# Patient Record
Sex: Male | Born: 1935
Health system: Southern US, Community
[De-identification: ages and names within clinical notes are randomized; demographics above are authoritative.]

## PROBLEM LIST (undated history)

## (undated) DIAGNOSIS — M1990 Unspecified osteoarthritis, unspecified site: Secondary | ICD-10-CM

## (undated) DIAGNOSIS — I10 Essential (primary) hypertension: Secondary | ICD-10-CM

## (undated) DIAGNOSIS — N400 Enlarged prostate without lower urinary tract symptoms: Secondary | ICD-10-CM

## (undated) DIAGNOSIS — H269 Unspecified cataract: Secondary | ICD-10-CM

## (undated) DIAGNOSIS — C801 Malignant (primary) neoplasm, unspecified: Secondary | ICD-10-CM

## (undated) DIAGNOSIS — E119 Type 2 diabetes mellitus without complications: Secondary | ICD-10-CM

## (undated) HISTORY — PX: CHEST EXPLORATION: SHX1337

## (undated) HISTORY — PX: COLON RESECTION: SHX5231

## (undated) HISTORY — PX: APPENDECTOMY: SHX54

## (undated) HISTORY — DX: Unspecified cataract: H26.9

## (undated) HISTORY — DX: Unspecified osteoarthritis, unspecified site: M19.90

---

## 2006-03-04 ENCOUNTER — Encounter (INDEPENDENT_AMBULATORY_CARE_PROVIDER_SITE_OTHER): Payer: Self-pay | Admitting: Specialist

## 2006-03-04 ENCOUNTER — Observation Stay (HOSPITAL_COMMUNITY): Admission: AD | Admit: 2006-03-04 | Discharge: 2006-03-05 | Payer: Self-pay | Admitting: Urology

## 2006-03-04 ENCOUNTER — Encounter: Payer: Self-pay | Admitting: Urology

## 2008-05-17 ENCOUNTER — Ambulatory Visit (HOSPITAL_BASED_OUTPATIENT_CLINIC_OR_DEPARTMENT_OTHER): Admission: RE | Admit: 2008-05-17 | Discharge: 2008-05-17 | Payer: Self-pay | Admitting: Urology

## 2008-08-14 ENCOUNTER — Ambulatory Visit (HOSPITAL_COMMUNITY): Admission: RE | Admit: 2008-08-14 | Discharge: 2008-08-15 | Payer: Self-pay | Admitting: Urology

## 2008-11-02 ENCOUNTER — Ambulatory Visit (HOSPITAL_COMMUNITY): Admission: RE | Admit: 2008-11-02 | Discharge: 2008-11-02 | Payer: Self-pay | Admitting: Urology

## 2010-06-07 LAB — BASIC METABOLIC PANEL
CO2: 27 mEq/L (ref 19–32)
Calcium: 9.3 mg/dL (ref 8.4–10.5)
GFR calc Af Amer: 51 mL/min — ABNORMAL LOW (ref >60)
GFR calc non Af Amer: 43 mL/min — ABNORMAL LOW (ref >60)
Glucose, Bld: 101 mg/dL — ABNORMAL HIGH (ref 70–99)
Potassium: 3.9 mEq/L (ref 3.5–5.1)
Sodium: 141 mEq/L (ref 135–145)

## 2010-06-07 LAB — CBC
HCT: 31.8 % — ABNORMAL LOW (ref 39.0–52.0)
MCHC: 34.1 g/dL (ref 30.0–36.0)
MCV: 89.2 fL (ref 78.0–100.0)
Platelets: 348 10*3/uL (ref 150–400)
RBC: 3.56 MIL/uL — ABNORMAL LOW (ref 4.22–5.81)
RDW: 14.4 % (ref 11.5–15.5)
WBC: 6.7 10*3/uL (ref 4.0–10.5)

## 2010-06-10 LAB — BASIC METABOLIC PANEL
BUN: 41 mg/dL — ABNORMAL HIGH (ref 6–23)
CO2: 26 mEq/L (ref 19–32)
Calcium: 8.6 mg/dL (ref 8.4–10.5)
Chloride: 106 mEq/L (ref 96–112)
Creatinine, Ser: 3.41 mg/dL — ABNORMAL HIGH (ref 0.4–1.5)
Creatinine, Ser: 3.79 mg/dL — ABNORMAL HIGH (ref 0.4–1.5)
GFR calc Af Amer: 19 mL/min — ABNORMAL LOW (ref >60)
GFR calc Af Amer: 21 mL/min — ABNORMAL LOW (ref >60)
Potassium: 4.2 mEq/L (ref 3.5–5.1)

## 2010-06-10 LAB — GLUCOSE, CAPILLARY
Glucose-Capillary: 100 mg/dL — ABNORMAL HIGH (ref 70–99)
Glucose-Capillary: 147 mg/dL — ABNORMAL HIGH (ref 70–99)
Glucose-Capillary: 151 mg/dL — ABNORMAL HIGH (ref 70–99)
Glucose-Capillary: 182 mg/dL — ABNORMAL HIGH (ref 70–99)
Glucose-Capillary: 191 mg/dL — ABNORMAL HIGH (ref 70–99)
Glucose-Capillary: 48 mg/dL — ABNORMAL LOW (ref 70–99)
Glucose-Capillary: 70 mg/dL (ref 70–99)

## 2010-06-10 LAB — CBC
HCT: 33.3 % — ABNORMAL LOW (ref 39.0–52.0)
Hemoglobin: 11.3 g/dL — ABNORMAL LOW (ref 13.0–17.0)
MCHC: 33.9 g/dL (ref 30.0–36.0)
MCV: 88.2 fL (ref 78.0–100.0)
Platelets: 303 10*3/uL (ref 150–400)
RBC: 3.51 MIL/uL — ABNORMAL LOW (ref 4.22–5.81)
RBC: 3.77 MIL/uL — ABNORMAL LOW (ref 4.22–5.81)
WBC: 9.3 10*3/uL (ref 4.0–10.5)

## 2010-06-13 LAB — CBC
Hemoglobin: 12.4 g/dL — ABNORMAL LOW (ref 13.0–17.0)
MCHC: 33.5 g/dL (ref 30.0–36.0)
MCV: 86.6 fL (ref 78.0–100.0)
RBC: 4.27 MIL/uL (ref 4.22–5.81)
RDW: 13.6 % (ref 11.5–15.5)

## 2010-06-13 LAB — BASIC METABOLIC PANEL
CO2: 25 mEq/L (ref 19–32)
Calcium: 9.2 mg/dL (ref 8.4–10.5)
Chloride: 110 mEq/L (ref 96–112)
GFR calc Af Amer: >60 mL/min (ref >60)
Glucose, Bld: 96 mg/dL (ref 70–99)
Sodium: 141 mEq/L (ref 135–145)

## 2010-06-13 LAB — URINALYSIS, ROUTINE W REFLEX MICROSCOPIC
Bilirubin Urine: NEGATIVE
Nitrite: NEGATIVE
Protein, ur: 30 mg/dL — AB
Specific Gravity, Urine: 1.02 (ref 1.005–1.030)
Urobilinogen, UA: 0.2 mg/dL (ref 0.0–1.0)

## 2010-06-13 LAB — URINE MICROSCOPIC-ADD ON

## 2010-07-16 NOTE — Op Note (Signed)
NAME:  Lucas David, Lucas David                  ACCOUNT NO.:  [id number]   MEDICAL RECORD NO.:  [id number]          PATIENT TYPE:  OIB   LOCATION:  1517                         FACILITY:  [facility]   PHYSICIAN:  Excell Seltzer. Annabell Howells, M.D.    DATE OF BIRTH:  10/02/1926   DATE OF PROCEDURE:  08/14/2008  DATE OF DISCHARGE:                               OPERATIVE REPORT   PROCEDURE:  Cystoscopy, right retrograde pyelogram with interpretation,  insertion of right double-J stent.   PREOPERATIVE DIAGNOSIS:  Right ureteropelvic junction (UPJ) stone with  pyelonephritis.   POSTOPERATIVE DIAGNOSIS:  Right ureteropelvic junction (UPJ) stone with  pyelonephritis.   SURGEON:  Excell Seltzer. Annabell Howells, MD.   ANESTHESIA:  General.   SPECIMEN:  None.   DRAIN:  6-French, 26 cm, right double-J stent.   COMPLICATIONS:  None.   INDICATIONS:  [name] is a 77 year old white male, who has been  managed by Dr. Aldean Ast in the past for stones.  He has known bilateral  renal calculi and presented today with a 3-day history of right flank  pain and fever to 102.5.  A CT scan in the office revealed about a 9 mm  right UPJ stone with obstruction.  His urinalysis appeared infected.  It  was felt that stenting was indicated with admission overnight for  observation.   FINDINGS/PROCEDURE:  The patient had been given Rocephin 1 g in the  office and 400 mg of Cipro I.V. in the operating room.  He was found  have creatinine of 3.89 preoperatively and a white count of 12.3.  He  was given a general anesthetic and placed in lithotomy position.  The  perineum and genitalia were prepped with Betadine solution.  He was  draped in the usual sterile fashion.  Cystoscopy was performed using the  22-French scope and 12 and 70 degrees lenses.  Examination revealed a  normal urethra, the external sphincter was intact, the prostatic urethra  was approximately 3 to 4 cm in length with trilobar hyperplasia with  coaptation and obstruction.   Examination of the bladder revealed mild to  moderate trabeculation.  The left ureteral orifice was unremarkable.  The right ureteral orifice had a horseshoe configuration and was  slightly lateral.   A 5-French open-end catheter was placed in the right ureteral orifice  and contrast was instilled.  This demonstrated a normal ureter to the  UPJ where there was an ovoid filling defect consistent with the stone.  This filling defect was faintly radiopaque.  No contrast effluxed by the  stone.   Once retrograde pyelogram had been completed, a guidewire was passed  beyond the stone into the kidney and a 6-French, 26 cm, double-J stent  without string was passed without difficulty.  The wire was removed  leaving a good  coil in the kidney, a good coil in the bladder.  Turbid urine was noted  to efflux from the stent.  The bladder was drained, and the patient was  taken down from the lithotomy position.  His anesthetic was reversed.  He was moved to the recovery room  in stable condition, and there were no  complications.      Excell Seltzer. Annabell Howells, M.D.  Electronically Signed     JJW/MEDQ  D:  08/14/2008  T:  08/14/2008  Job:  [id number]

## 2010-07-16 NOTE — Op Note (Signed)
NAME:  Lucas David, Lucas David                  ACCOUNT NO.:  [id number]   MEDICAL RECORD NO.:  [id number]          PATIENT TYPE:  AMB   LOCATION:  NESC                         FACILITY:  [facility]   PHYSICIAN:  Courtney Paris, M.D.DATE OF BIRTH:  19-Jan-1927   DATE OF PROCEDURE:  05/17/2008  DATE OF DISCHARGE:                               OPERATIVE REPORT   PREOPERATIVE DIAGNOSES:  Bladder calculi.   POSTOPERATIVE DIAGNOSES:  Bladder calculi.   PROCEDURES PERFORMED:  1. Cystourethroscopy.  2. Laser lithotripsy and cysto litholapaxy (>2.5 cm stones).   SURGEON:  Courtney Paris, M.D.   RESIDENT:  Melburn Hake   ANESTHESIA:  General.   DRAINS:  22-French Foley catheter.   COMPLICATIONS:  None.   ESTIMATED BLOOD LOSS:  Minimal.   INDICATIONS FOR PROCEDURE:  Patient is a 77 year old gentleman who was  evaluated by Dr. Vic Blackbird in urology clinic for complaints of  difficulty with urination.  Patient had a history of nephrolithiasis,  underwent a CT scan of the abdomen and pelvis, which revealed  questionable bladder calculi, versus bilateral ureterocele stone.  Patient underwent office cystoscopy, which revealed two large bladder  calculi.  Patient was counseled about different treatment options.  Patient chose cystoscopy, laser lithotripsy and removal of stone.  Risks  and benefits of the procedure were explained, informed consent obtained.   DESCRIPTION OF PROCEDURE IN DETAIL:  Patient brought to the operating  room, placed in supine position, administered general anesthesia by the  anesthesia team.  Proper time-out was performed, identifying patient,  procedure and the site.  Patient was given appropriate preoperative  antibiotic.  Patient was  placed in dorsal lithotomy position.  Pressure  points were padded.  Bilateral lower extremity SCDs were applied.  Patient prepped and draped in the usual sterile manner.   Using a 22-French sheath and a 12-degree lens, we  then performed  cystourethroscopy.  Patient's anterior urethra was normal.  Posterior  urethra revealed moderate bilobar prosthetic enlargement up underneath  the bladder.  The right and left ureteric orifices were found normal, in  normal position.  The rest of the cystoscopy did not reveal any mass or  lesion.  However, there were two large bladder calculi at the base of  the bladder (>2.5 cm ).   We then introduced 400-micron laser fiber and systematically performed  laser lithotripsy of both the stones.  The stones were broken down into  tiny pieces, less than 5 mm in size.  We used total of 9.9 kilojoules of  energy.  The laser lithotripsy was used initially at a setting of 0.6  and 6.  It was advanced to 0.8 and 8 and then 1 and 10.  We then used  the Crest syringe and performed bladder irrigation.  Upon re-look with  the cystoscope, only a small amount of debris was left.  There were five  or six pieces that were about 5 mm or so.  We then used the Nitinol  basket and performed extraction of these stones.  Once again, we  irrigated the bladder.  No more  significant stone pieces were seen.  At  that point we drained the patient's bladder and removed the cystoscopic  sheath.  We then placed a 22-French Foley catheter and irrigated the  catheter to light pink.  This marked the end of our procedure.  Patient  was subsequently extubated and transferred in stable condition to the  recovery room.   Note:  Dr. Aldean Ast was present and available for all the aspects of  the case.     ______________________________  Melburn Hake, MD      Courtney Paris, M.D.  Electronically Signed    JJ/MEDQ  D:  05/17/2008  T:  05/17/2008  Job:  [id number]

## 2010-07-16 NOTE — Discharge Summary (Signed)
NAME:  Lucas David, Lucas David                  ACCOUNT NO.:  [id number]   MEDICAL RECORD NO.:  [id number]          PATIENT TYPE:  OIB   LOCATION:  1517                         FACILITY:  [facility]   PHYSICIAN:  Excell Seltzer. Annabell Howells, M.D.    DATE OF BIRTH:  [birth year]   DATE OF ADMISSION:  08/14/2008  DATE OF DISCHARGE:  08/15/2008                               DISCHARGE SUMMARY   Doreen Beam, MD  [address]  [place], [place] [address]   Dear Dr. Sherril Croon:   Mr. Swor was admitted to the hospital on August 14, 2008 for an  obstructing right UPJ stone with pyelonephritis/urosepsis. At the time  of admission he was found to have a creatinine of 3.79, up from 1.18 in  March. He underwent placement of a right ureteral stent on August 14, 2008  and was given Rocephin in our office followed by Cipro.  His urine  culture is pending.  A follow-up creatinine on August 14, 2008 was down to  3.41.  His glucose was 242. His Metformin has been held because of his  elevated creatinine, which should recover now that he is been stented  and placed on antibiotics.   I have asked him to follow up with you later this week for repeat BUN  and creatinine. I will defer to your judgment when he can resume his  metformin. He will follow up with Dr. Aldean Ast next week to discuss  further therapy of his stone.   I you have any questions please feel free to contact me.   Respectfully,   Excell Seltzer. Annabell Howells, MD   __________      Excell Seltzer. Annabell Howells, M.D.  Electronically Signed     JJW/MEDQ  D:  08/15/2008  T:  08/15/2008  Job:  [id number]

## 2010-07-19 NOTE — Discharge Summary (Signed)
NAME:  Lucas David, Lucas David                  ACCOUNT NO.:  [id number]   MEDICAL RECORD NO.:  [id number]          PATIENT TYPE:  OBV   LOCATION:  1437                         FACILITY:  [facility]   PHYSICIAN:  Courtney Paris, M.D.DATE OF BIRTH:  04-27-1926   DATE OF ADMISSION:  03/04/2006  DATE OF DISCHARGE:  03/05/2006                               DISCHARGE SUMMARY   DISCHARGE DIAGNOSES:  1. Bladder stones.  2. Obstructive uropathy.  3. Hematuria.  4. Inflammatory lesion in bladder.  5. Type 2 diabetes.   OPERATIONS AND PROCEDURES:  Bladder biopsy and vesicolithopexy (greater  than 2.5 cm) on March 04, 2006.   BRIEF HISTORY:  The 77 year old patient was admitted with an  inflammatory bladder lesion for biopsy and obstructive uropathy and a  large bladder stone in his bladder.  He had the biopsy and the bladder  stone was quite hard and took nearly 2 hours of time.  When I finished  he was having some oozing.  I could not get all the stone fragments out  and I felt it was best to admit him for extended observation.  He had  several pieces in the bladder.  He has bilateral 7 mm kidney stones but  has never passed any.  He does have nocturia every 1 hour.  He has a  small capacity obstructing unstable bladder but with the lesion in the  bladder which may have been due to the bladder stone, I wanted to make  sure that this was not cancer before further definitive therapy was  undertaken.  He did have some bladder sounds removed by Dr. Rito Ehrlich in  [place], in June as well.   After surgery he was admitted for continuous bladder irrigation and  follow-up.  He did well.  The urine cleared up overnight.  The catheter  came out and he has been able to void without blood.  He has passed a  few small stones and was thought able to go home.  He was sent home on  his metformin, his gemfibrozil, hydrochlorothiazide, glipizide,  lisinopril and finasteride.  I added some Flomax to him as well and  some  Cipro for 10 days.  He will come back to the office next week for follow-  up.  He did have on his chest x-ray a right upper lobe pulmonary nodular  opacity so a CT scan will be done when he comes back for follow-up.  Sent home in improved ambulatory condition on a regular diet.      Courtney Paris, M.D.  Electronically Signed     HMK/MEDQ  D:  03/05/2006  T:  03/05/2006  Job:  [id number]

## 2010-07-19 NOTE — Op Note (Signed)
NAME:  Lucas David, Lucas David                  ACCOUNT NO.:  [id number]   MEDICAL RECORD NO.:  [id number]          PATIENT TYPE:  AMB   LOCATION:  NESC                         FACILITY:  [facility]   PHYSICIAN:  Courtney Paris, M.D.DATE OF BIRTH:  02-26-1927   DATE OF PROCEDURE:  03/04/2006  DATE OF DISCHARGE:                               OPERATIVE REPORT   PREOPERATIVE DIAGNOSIS:  Inflammatory bladder lesion, obstructive  uropathy, large bladder stones.   POSTOPERATIVE DIAGNOSIS:  Inflammatory bladder lesion, obstructive  uropathy, large bladder stones.   OPERATION:  Cysto, bladder biopsy and (1.5 cm), electrohydraulic  lithotripsy of large bladder stone.   ANESTHESIA:  General.   SURGEON:  Courtney Paris, M.D.   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  He had  some stones removed by Dr. Rito Ehrlich 08/2005 but still has a fairly large  stone present in the bladder.  He has bilateral kidney stones but has  never passed and he is diabetic.  He has a markedly obstructive unstable  bladder on urodynamics and enters now to have biopsy of this  inflammatory bladder lesion which could just be from the stone  irritation of the bladder but we want to make sure it was not cancer and  then try to break up the bladder stone now.  He probably will need TURP  in the future but want to make sure that this was not a bladder cancer  and try to break the stone up now if possible.   The patient was placed on the operating table in dorsal lithotomy  position.  After satisfactory induction of general anesthesia, he was  prepped and draped with Betadine in the usual sterile fashion.  He was  given IV antibiotics.  The panendoscope was inserted.  No anterior  strictures were seen but he had a very large trilobar prostate about 5  to 6 cm in length.  The bladder was entered and the rather large stone  and  another smaller stone was seen in the bladder.  The lesion of  posterior bladder wall was photographed and then biopsied with a cold  cup biopsy forceps and the base fulgurated with the Bugbee electrode.  This effected good hemostasis.  Then using a 500 micron fiber with a  holmium laser, the stone was then addressed.  It was very hard and  actually burned through two fibers and was working on a third when I was  able to break the stone into enough pieces that I thought I could  irrigate.  I tried to pull some with grasping forceps but there was some  oozing present and prevented very clear view.  For this reason I got out  as many stone fragments as I could, I placed  a 24 two-way Foley  catheter and with irrigant with 2000 mL of saline was able get a few  blood clots out and a few more stone fragments but left this to  continuous irrigation.  We will admit him for extended recovery  and then  try to remove the catheter in the morning.  The length of the time spent  just lasering was an hour and a half, due to this very hard stone.      Courtney Paris, M.D.  Electronically Signed    HMK/MEDQ  D:  03/04/2006  T:  03/04/2006  Job:  [id number]

## 2010-08-07 ENCOUNTER — Other Ambulatory Visit: Payer: Self-pay | Admitting: Urology

## 2010-08-07 DIAGNOSIS — N200 Calculus of kidney: Secondary | ICD-10-CM

## 2010-08-23 ENCOUNTER — Encounter (HOSPITAL_COMMUNITY): Payer: Medicare Other

## 2010-08-23 ENCOUNTER — Other Ambulatory Visit (HOSPITAL_COMMUNITY): Payer: Self-pay | Admitting: Urology

## 2010-08-23 ENCOUNTER — Other Ambulatory Visit: Payer: Self-pay | Admitting: Interventional Radiology

## 2010-08-23 ENCOUNTER — Ambulatory Visit (HOSPITAL_COMMUNITY)
Admission: RE | Admit: 2010-08-23 | Discharge: 2010-08-23 | Disposition: A | Discharge status: Home / Self Care | Payer: Medicare Other | Source: Ambulatory Visit | Attending: Urology | Admitting: Urology

## 2010-08-23 DIAGNOSIS — I517 Cardiomegaly: Secondary | ICD-10-CM | POA: Insufficient documentation

## 2010-08-23 DIAGNOSIS — Z01810 Encounter for preprocedural cardiovascular examination: Secondary | ICD-10-CM | POA: Insufficient documentation

## 2010-08-23 DIAGNOSIS — N200 Calculus of kidney: Secondary | ICD-10-CM | POA: Insufficient documentation

## 2010-08-23 DIAGNOSIS — E119 Type 2 diabetes mellitus without complications: Secondary | ICD-10-CM | POA: Insufficient documentation

## 2010-08-23 DIAGNOSIS — I10 Essential (primary) hypertension: Secondary | ICD-10-CM | POA: Insufficient documentation

## 2010-08-23 DIAGNOSIS — Z01818 Encounter for other preprocedural examination: Secondary | ICD-10-CM | POA: Insufficient documentation

## 2010-08-23 LAB — BASIC METABOLIC PANEL
BUN: 37 mg/dL — ABNORMAL HIGH (ref 6–23)
Calcium: 9.6 mg/dL (ref 8.4–10.5)
Creatinine, Ser: 2.07 mg/dL — ABNORMAL HIGH (ref 0.50–1.35)
GFR calc Af Amer: 37 mL/min — ABNORMAL LOW (ref >60)
GFR calc non Af Amer: 31 mL/min — ABNORMAL LOW (ref >60)

## 2010-08-23 LAB — CBC
HCT: 32.7 % — ABNORMAL LOW (ref 39.0–52.0)
MCH: 27.8 pg (ref 26.0–34.0)
MCHC: 31.5 g/dL (ref 30.0–36.0)
MCV: 88.1 fL (ref 78.0–100.0)
Platelets: 271 10*3/uL (ref 150–400)
RDW: 14.3 % (ref 11.5–15.5)

## 2010-08-23 LAB — SURGICAL PCR SCREEN: MRSA, PCR: NEGATIVE

## 2010-09-02 ENCOUNTER — Ambulatory Visit (HOSPITAL_COMMUNITY): Payer: Medicare Other

## 2010-09-02 ENCOUNTER — Ambulatory Visit (HOSPITAL_COMMUNITY)
Admission: RE | Admit: 2010-09-02 | Discharge: 2010-09-03 | Disposition: A | Discharge status: Home / Self Care | Payer: Medicare Other | Source: Ambulatory Visit | Attending: Urology | Admitting: Urology

## 2010-09-02 ENCOUNTER — Ambulatory Visit (HOSPITAL_COMMUNITY)
Admission: RE | Admit: 2010-09-02 | Discharge: 2010-09-02 | Disposition: A | Discharge status: Home / Self Care | Payer: Medicare Other | Source: Ambulatory Visit | Attending: Urology | Admitting: Urology

## 2010-09-02 DIAGNOSIS — Z01812 Encounter for preprocedural laboratory examination: Secondary | ICD-10-CM | POA: Insufficient documentation

## 2010-09-02 DIAGNOSIS — N200 Calculus of kidney: Secondary | ICD-10-CM | POA: Insufficient documentation

## 2010-09-02 DIAGNOSIS — E7800 Pure hypercholesterolemia, unspecified: Secondary | ICD-10-CM | POA: Insufficient documentation

## 2010-09-02 DIAGNOSIS — Z79899 Other long term (current) drug therapy: Secondary | ICD-10-CM | POA: Insufficient documentation

## 2010-09-02 DIAGNOSIS — Z85038 Personal history of other malignant neoplasm of large intestine: Secondary | ICD-10-CM | POA: Insufficient documentation

## 2010-09-02 DIAGNOSIS — I10 Essential (primary) hypertension: Secondary | ICD-10-CM | POA: Insufficient documentation

## 2010-09-02 DIAGNOSIS — E119 Type 2 diabetes mellitus without complications: Secondary | ICD-10-CM | POA: Insufficient documentation

## 2010-09-02 LAB — GLUCOSE, CAPILLARY
Glucose-Capillary: 108 mg/dL — ABNORMAL HIGH (ref 70–99)
Glucose-Capillary: 92 mg/dL (ref 70–99)
Glucose-Capillary: 101 mg/dL — ABNORMAL HIGH (ref 70–99)
Glucose-Capillary: 144 mg/dL — ABNORMAL HIGH (ref 70–99)
Glucose-Capillary: 177 mg/dL — ABNORMAL HIGH (ref 70–99)

## 2010-09-02 LAB — TYPE AND SCREEN
ABO/RH(D): A POS
ABO/RH(D): A POS
Antibody Screen: NEGATIVE

## 2010-09-02 LAB — HEMOGLOBIN AND HEMATOCRIT, BLOOD
HCT: 29.7 % — ABNORMAL LOW (ref 39.0–52.0)
Hemoglobin: 9.5 g/dL — ABNORMAL LOW (ref 13.0–17.0)

## 2010-09-02 LAB — ABO/RH: ABO/RH(D): A POS

## 2010-09-02 MED ORDER — IOHEXOL 300 MG/ML  SOLN: 50.0000 mL | Freq: Once | INTRAMUSCULAR | Status: AC | PRN

## 2010-09-03 LAB — GLUCOSE, CAPILLARY: Glucose-Capillary: 106 mg/dL — ABNORMAL HIGH (ref 70–99)

## 2010-09-11 NOTE — Op Note (Signed)
NAMEKULLEN, TOMASETTI                  ACCOUNT NO.:  [id number]  MEDICAL RECORD NO.:  [id number]  LOCATION:  1402                         FACILITY:  [facility]  PHYSICIAN:  Marlowe Cinquemani C. Vernie Ammons, M.D.  DATE OF BIRTH:  November 17, 1926  DATE OF PROCEDURE:  09/02/2010 DATE OF DISCHARGE:                              OPERATIVE REPORT   PREOPERATIVE DIAGNOSIS:  Left partial staghorn calculus.  POSTOPERATIVE DIAGNOSIS:  Left partial staghorn calculus.  PROCEDURES: 1. Left percutaneous nephrostolithotomy. 2. Antegrade double-J stent placement.  SURGEON:  Heman Que C. Vernie Ammons, M.D.  ANESTHESIA:  General.  SPECIMENS:  Stone given to the patient.  DRAINS:  18-French Foley catheter in the bladder and a 6-French, 24-cm double-J stent in the left ureter (no string).  BLOOD LOSS:  Less than 100 mL.  COMPLICATIONS:  None.  INDICATIONS:  The patient is an 77 year old male with a left partial staghorn calculus located in the lower pole and extending in the renal pelvis with Hounsfield units of approximately 500.  He was having difficulty with recurrent infections and it was thought that this could possibly be serving as a nidus of infection.  The stone measured 3.5 cm in length.  The procedure, its risks and complications as well as alternatives were discussed and the patient has elected to proceed with surgery.  DESCRIPTION OF OPERATION:  After informed consent, the patient was brought to the major OR, placed on table, administered general anesthesia and then moved to the prone position.  His left flank as well as nephrostomy catheter was then sterilely prepped and draped and an official time-out was then performed.  The 0.038-inch super stiff guidewire was then passed through the nephrostomy catheter and down into the bladder under direct fluoroscopic visualization.  I then made a transverse incision at the location of the nephrostomy catheter and removed the nephrostomy catheter leaving the guidewire in  place.  Next, a coaxial peel-away catheter was passed over the guidewire and down the ureter under fluoroscopy.  The inner portion of the coaxial system was removed leaving the guidewire in place and allowing the passage of a second guidewire down into the bladder.  Both guidewires were left in place and the coaxial catheter was then removed. I then dilated the nephrostomy tract with a nephrostomy dilating balloon, inflated to 14 atmospheres under fluoroscopy and then passed the 30-French nephrostomy sheath over the balloon and into the area of the lower pole calyx.  The balloon was deflated and backed over the guidewire leaving the nephrostomy catheter and working as well as safety guidewires in place.  The 28-French rigid nephroscope was then passed under direct vision and some clot seen in the collecting system were evacuated.  I then was able to visualize the stone and it was fragmented initially and several small pieces which were then grasped using a combination of the two-prong grasper as well as three-prong grasper.  Several smaller stones were located in the lower pole calyx and these were grasped and removed as well.  Then, the large portion that extended into the area of the renal pelvis was fragmented and extracted in identical fashion.  I then inspected and found more stones in what  appeared to be a parallel calyx in the lower pole.  This was extracted as well after fragmentation with the EchoStar.  I then inspected all the should the calyces of the lower pole as well as the renal pelvis and noted no further fragments.  I could see down to the UPJ and down the proximal ureter, and no stone fragments were located in this region either.  I then evaluated the collecting system and renal pelvis extending toward the upper pole and no stone was located in this region either.  Having cleared him of all stones and having had very minimal bleeding throughout the procedure, I  elected to proceed without the placement of the nephrostomy tube, but did elect to place a stent.  The rigid nephroscope was back loaded over the working guidewire and the double-J stent was passed over the guidewire under fluoroscopy into the area of the bladder.  As I began to remove the guidewire, good curl was noted in the bladder.  I completely removed the guidewire and then positioned the stent in the renal pelvis region with two-prong graspers through the nephroscope.  I then measured the distance from the edge of the renal parenchyma to the top of the nephrostomy sheath and then used this distance to Jacquelyn Antony the laparoscopic FloSeal introducer.  I passed the laparoscopic instrument through the access sheath and then began to remove the access sheath while maintaining this in the position that would place it just outside the collecting system and then began injecting the FloSeal as I slowly removed the laparoscopic applicator.  A total of 10 mL of FloSeal were used to control any bleeding and none was noted after I removed the device.  I therefore closed the skin with a running subcuticular 4-0 Monocryl suture and applied a sterile dressing.  The patient was awakened and taken to recovery room in stable and satisfactory condition.  He tolerated the procedure well with no intraoperative complications.  He will be observed overnight and maintained on intravenous antibiotics with anticipation of discharge in the morning.     Odysseus Cada C. Vernie Ammons, M.D.     MCO/MEDQ  D:  09/02/2010  T:  09/02/2010  Job:  [id number]  Electronically Signed by Ihor Gully M.D. on 09/11/2010 04:48:40 AM

## 2011-12-09 ENCOUNTER — Encounter (HOSPITAL_COMMUNITY): Payer: Self-pay

## 2011-12-09 ENCOUNTER — Encounter (HOSPITAL_COMMUNITY): Payer: Self-pay | Admitting: Pharmacy Technician

## 2011-12-09 ENCOUNTER — Encounter (HOSPITAL_COMMUNITY)
Admission: RE | Admit: 2011-12-09 | Discharge: 2011-12-09 | Disposition: A | Discharge status: Home / Self Care | Payer: Medicare Other | Source: Ambulatory Visit | Attending: Ophthalmology | Admitting: Ophthalmology

## 2011-12-09 HISTORY — DX: Essential (primary) hypertension: I10

## 2011-12-09 HISTORY — DX: Type 2 diabetes mellitus without complications: E11.9

## 2011-12-09 HISTORY — DX: Benign prostatic hyperplasia without lower urinary tract symptoms: N40.0

## 2011-12-09 HISTORY — DX: Malignant (primary) neoplasm, unspecified: C80.1

## 2011-12-09 LAB — BASIC METABOLIC PANEL
BUN: 36 mg/dL — ABNORMAL HIGH (ref 6–23)
Calcium: 10 mg/dL (ref 8.4–10.5)
GFR calc non Af Amer: 34 mL/min — ABNORMAL LOW (ref >90)
Glucose, Bld: 171 mg/dL — ABNORMAL HIGH (ref 70–99)

## 2011-12-09 NOTE — Patient Instructions (Addendum)
Your procedure is scheduled on: 12/15/2011  Report to [facility] at  1030       AM.  Call this number if you have problems the morning of surgery: [phone]   Do not eat food or drink liquids :After Midnight.      Take these medicines the morning of surgery with A SIP OF WATER:proscar,hctz,lisinopril,ditropan,flomax    Do not wear jewelry, make-up or nail polish.  Do not wear lotions, powders, or perfumes. You may wear deodorant.  Do not shave 48 hours prior to surgery.  Do not bring valuables to the hospital.  Contacts, dentures or bridgework may not be worn into surgery.  Leave suitcase in the car. After surgery it may be brought to your room.  For patients admitted to the hospital, checkout time is 11:00 AM the day of discharge.   Patients discharged the day of surgery will not be allowed to drive home.  :     Please read over the following fact sheets that you were given: Coughing and Deep Breathing, Surgical Site Infection Prevention, Anesthesia Post-op Instructions and Care and Recovery After Surgery    Cataract A cataract is a clouding of the lens of the eye. When a lens becomes cloudy, vision is reduced based on the degree and nature of the clouding. Many cataracts reduce vision to some degree. Some cataracts make people more near-sighted as they develop. Other cataracts increase glare. Cataracts that are ignored and become worse can sometimes look white. The white color can be seen through the pupil. CAUSES   Aging. However, cataracts may occur at any age, even in newborns.   Certain drugs.   Trauma to the eye.   Certain diseases such as diabetes.   Specific eye diseases such as chronic inflammation inside the eye or a sudden attack of a rare form of glaucoma.   Inherited or acquired medical problems.  SYMPTOMS   Gradual, progressive drop in vision in the affected eye.   Severe, rapid visual loss. This most often happens when trauma is the cause.  DIAGNOSIS  To  detect a cataract, an eye doctor examines the lens. Cataracts are best diagnosed with an exam of the eyes with the pupils enlarged (dilated) by drops.  TREATMENT  For an early cataract, vision may improve by using different eyeglasses or stronger lighting. If that does not help your vision, surgery is the only effective treatment. A cataract needs to be surgically removed when vision loss interferes with your everyday activities, such as driving, reading, or watching TV. A cataract may also have to be removed if it prevents examination or treatment of another eye problem. Surgery removes the cloudy lens and usually replaces it with a substitute lens (intraocular lens, IOL).  At a time when both you and your doctor agree, the cataract will be surgically removed. If you have cataracts in both eyes, only one is usually removed at a time. This allows the operated eye to heal and be out of danger from any possible problems after surgery (such as infection or poor wound healing). In rare cases, a cataract may be doing damage to your eye. In these cases, your caregiver may advise surgical removal right away. The vast majority of people who have cataract surgery have better vision afterward. HOME CARE INSTRUCTIONS  If you are not planning surgery, you may be asked to do the following:  Use different eyeglasses.   Use stronger or brighter lighting.   Ask your eye doctor about  reducing your medicine dose or changing medicines if it is thought that a medicine caused your cataract. Changing medicines does not make the cataract go away on its own.   Become familiar with your surroundings. Poor vision can lead to injury. Avoid bumping into things on the affected side. You are at a higher risk for tripping or falling.   Exercise extreme care when driving or operating machinery.   Wear sunglasses if you are sensitive to bright light or experiencing problems with glare.  SEEK IMMEDIATE MEDICAL CARE IF:   You have  a worsening or sudden vision loss.   You notice redness, swelling, or increasing pain in the eye.   You have a fever.  Document Released: 02/17/2005 Document Revised: 02/06/2011 Document Reviewed: 10/11/2010 [facility] Patient Information 2012 [place], [place].PATIENT INSTRUCTIONS POST-ANESTHESIA  IMMEDIATELY FOLLOWING SURGERY:  Do not drive or operate machinery for the first twenty four hours after surgery.  Do not make any important decisions for twenty four hours after surgery or while taking narcotic pain medications or sedatives.  If you develop intractable nausea and vomiting or a severe headache please notify your doctor immediately.  FOLLOW-UP:  Please make an appointment with your surgeon as instructed. You do not need to follow up with anesthesia unless specifically instructed to do so.  WOUND CARE INSTRUCTIONS (if applicable):  Keep a dry clean dressing on the anesthesia/puncture wound site if there is drainage.  Once the wound has quit draining you may leave it open to air.  Generally you should leave the bandage intact for twenty four hours unless there is drainage.  If the epidural site drains for more than 36-48 hours please call the anesthesia department.  QUESTIONS?:  Please feel free to call your physician or the hospital operator if you have any questions, and they will be happy to assist you.

## 2011-12-12 MED ORDER — LIDOCAINE HCL 3.5 % OP GEL
OPHTHALMIC | Status: AC
  Filled 2011-12-12: qty 5

## 2011-12-12 MED ORDER — TETRACAINE HCL 0.5 % OP SOLN
OPHTHALMIC | Status: AC
  Filled 2011-12-12: qty 2

## 2011-12-12 MED ORDER — LIDOCAINE HCL (PF) 1 % IJ SOLN
INTRAMUSCULAR | Status: AC
  Filled 2011-12-12: qty 2

## 2011-12-12 MED ORDER — PHENYLEPHRINE HCL 2.5 % OP SOLN
OPHTHALMIC | Status: AC
  Filled 2011-12-12: qty 2

## 2011-12-12 MED ORDER — CYCLOPENTOLATE HCL 1 % OP SOLN
OPHTHALMIC | Status: AC
  Filled 2011-12-12: qty 2

## 2011-12-12 MED ORDER — NEOMYCIN-POLYMYXIN-DEXAMETH 3.5-10000-0.1 OP OINT
TOPICAL_OINTMENT | OPHTHALMIC | Status: AC
  Filled 2011-12-12: qty 3.5

## 2011-12-15 ENCOUNTER — Encounter (HOSPITAL_COMMUNITY): Payer: Self-pay | Admitting: *Deleted

## 2011-12-15 ENCOUNTER — Ambulatory Visit (HOSPITAL_COMMUNITY): Payer: Medicare Other | Admitting: Anesthesiology

## 2011-12-15 ENCOUNTER — Ambulatory Visit (HOSPITAL_COMMUNITY)
Admission: RE | Admit: 2011-12-15 | Discharge: 2011-12-15 | Disposition: A | Discharge status: Home / Self Care | Payer: Medicare Other | Source: Ambulatory Visit | Attending: Ophthalmology | Admitting: Ophthalmology

## 2011-12-15 ENCOUNTER — Encounter (HOSPITAL_COMMUNITY)
Admission: RE | Disposition: A | Discharge status: Home / Self Care | Payer: Self-pay | Source: Ambulatory Visit | Attending: Ophthalmology

## 2011-12-15 ENCOUNTER — Encounter (HOSPITAL_COMMUNITY): Payer: Self-pay | Admitting: Anesthesiology

## 2011-12-15 ENCOUNTER — Encounter (HOSPITAL_COMMUNITY): Payer: Self-pay

## 2011-12-15 DIAGNOSIS — Z01812 Encounter for preprocedural laboratory examination: Secondary | ICD-10-CM | POA: Insufficient documentation

## 2011-12-15 DIAGNOSIS — I10 Essential (primary) hypertension: Secondary | ICD-10-CM | POA: Insufficient documentation

## 2011-12-15 DIAGNOSIS — E119 Type 2 diabetes mellitus without complications: Secondary | ICD-10-CM | POA: Insufficient documentation

## 2011-12-15 DIAGNOSIS — H2589 Other age-related cataract: Secondary | ICD-10-CM | POA: Insufficient documentation

## 2011-12-15 DIAGNOSIS — H2181 Floppy iris syndrome: Secondary | ICD-10-CM | POA: Insufficient documentation

## 2011-12-15 HISTORY — PX: CATARACT EXTRACTION W/PHACO: SHX586

## 2011-12-15 LAB — GLUCOSE, CAPILLARY: Glucose-Capillary: 100 mg/dL — ABNORMAL HIGH (ref 70–99)

## 2011-12-15 SURGERY — PHACOEMULSIFICATION, CATARACT, WITH IOL INSERTION
Anesthesia: Monitor Anesthesia Care | Site: Eye | Laterality: Right | Wound class: Clean

## 2011-12-15 MED ORDER — ONDANSETRON HCL 4 MG/2ML IJ SOLN: 4.0000 mg | Freq: Once | INTRAMUSCULAR | Status: DC | PRN

## 2011-12-15 MED ORDER — LIDOCAINE HCL (PF) 1 % IJ SOLN
INTRAOCULAR | Status: DC | PRN
  Administered 2011-12-15: 12:00:00 via OPHTHALMIC

## 2011-12-15 MED ORDER — CYCLOPENTOLATE-PHENYLEPHRINE 0.2-1 % OP SOLN: 1.0000 [drp] | OPHTHALMIC | Status: AC

## 2011-12-15 MED ORDER — PHENYLEPHRINE HCL 2.5 % OP SOLN
1.0000 [drp] | OPHTHALMIC | Status: AC
  Administered 2011-12-15 (×3): 1 [drp] via OPHTHALMIC

## 2011-12-15 MED ORDER — EPINEPHRINE HCL 1 MG/ML IJ SOLN
INTRAMUSCULAR | Status: AC
  Filled 2011-12-15: qty 1

## 2011-12-15 MED ORDER — FENTANYL CITRATE 0.05 MG/ML IJ SOLN: 25.0000 ug | INTRAMUSCULAR | Status: DC | PRN

## 2011-12-15 MED ORDER — MIDAZOLAM HCL 2 MG/2ML IJ SOLN
1.0000 mg | INTRAMUSCULAR | Status: DC | PRN
  Administered 2011-12-15: 2 mg via INTRAVENOUS

## 2011-12-15 MED ORDER — LIDOCAINE HCL 3.5 % OP GEL
1.0000 "application " | Freq: Once | OPHTHALMIC | Status: AC
  Administered 2011-12-15: 1 via OPHTHALMIC

## 2011-12-15 MED ORDER — NEOMYCIN-POLYMYXIN-DEXAMETH 0.1 % OP OINT
TOPICAL_OINTMENT | OPHTHALMIC | Status: DC | PRN
  Administered 2011-12-15: 1 via OPHTHALMIC

## 2011-12-15 MED ORDER — PROVISC 10 MG/ML IO SOLN
INTRAOCULAR | Status: DC | PRN
  Administered 2011-12-15: 8.5 mg via INTRAOCULAR

## 2011-12-15 MED ORDER — BSS IO SOLN
INTRAOCULAR | Status: DC | PRN
  Administered 2011-12-15: 15 mL via INTRAOCULAR

## 2011-12-15 MED ORDER — EPINEPHRINE HCL 1 MG/ML IJ SOLN
INTRAOCULAR | Status: DC | PRN
  Administered 2011-12-15: 12:00:00

## 2011-12-15 MED ORDER — TETRACAINE HCL 0.5 % OP SOLN
1.0000 [drp] | OPHTHALMIC | Status: AC
  Administered 2011-12-15 (×3): 1 [drp] via OPHTHALMIC

## 2011-12-15 MED ORDER — POVIDONE-IODINE 5 % OP SOLN
OPHTHALMIC | Status: DC | PRN
  Administered 2011-12-15: 1 via OPHTHALMIC

## 2011-12-15 MED ORDER — CYCLOPENTOLATE HCL 1 % OP SOLN
1.0000 [drp] | OPHTHALMIC | Status: AC
  Administered 2011-12-15 (×3): 1 [drp] via OPHTHALMIC

## 2011-12-15 MED ORDER — LACTATED RINGERS IV SOLN
INTRAVENOUS | Status: DC
  Administered 2011-12-15: 1000 mL via INTRAVENOUS

## 2011-12-15 MED ORDER — MIDAZOLAM HCL 2 MG/2ML IJ SOLN
INTRAMUSCULAR | Status: AC
  Filled 2011-12-15: qty 2

## 2011-12-15 SURGICAL SUPPLY — 12 items
CLOTH BEACON ORANGE TIMEOUT ST (SAFETY) ×1 IMPLANT
EYE SHIELD UNIVERSAL CLEAR (GAUZE/BANDAGES/DRESSINGS) ×1 IMPLANT
GLOVE BIOGEL PI IND STRL 6.5 (GLOVE) IMPLANT
GLOVE BIOGEL PI INDICATOR 6.5 (GLOVE) ×1
GLOVE EXAM NITRILE MD LF STRL (GLOVE) ×1 IMPLANT
PAD ARMBOARD 7.5X6 YLW CONV (MISCELLANEOUS) ×1 IMPLANT
RING MALYGIN (MISCELLANEOUS) ×1 IMPLANT
SIGHTPATH CAT PROC W REG LENS (Ophthalmic Related) ×2 IMPLANT
SYR TB 1ML LL NO SAFETY (SYRINGE) ×1 IMPLANT
TAPE SURG TRANSPORE 1 IN (GAUZE/BANDAGES/DRESSINGS) IMPLANT
TAPE SURGICAL TRANSPORE 1 IN (GAUZE/BANDAGES/DRESSINGS) ×1
WATER STERILE IRR 250ML POUR (IV SOLUTION) ×1 IMPLANT

## 2011-12-15 NOTE — Anesthesia Preprocedure Evaluation (Addendum)
Anesthesia Evaluation  Patient identified by MRN, date of birth, ID band Patient awake    Reviewed: Allergy & Precautions, H&P , NPO status , Patient's Chart, lab work & pertinent test results  Airway Mallampati: II      Dental  (+) Teeth Intact   Pulmonary neg pulmonary ROS,  breath sounds clear to auscultation        Cardiovascular hypertension, Pt. on medications Rhythm:Regular Rate:Bradycardia     Neuro/Psych    GI/Hepatic   Endo/Other  diabetes, Well Controlled, Type 2, Oral Hypoglycemic Agents  Renal/GU      Musculoskeletal   Abdominal   Peds  Hematology   Anesthesia Other Findings   Reproductive/Obstetrics                           Anesthesia Physical Anesthesia Plan  ASA: III  Anesthesia Plan: MAC   Post-op Pain Management:    Induction: Intravenous  Airway Management Planned: Nasal Cannula  Additional Equipment:   Intra-op Plan:   Post-operative Plan:   Informed Consent: I have reviewed the patients History and Physical, chart, labs and discussed the procedure including the risks, benefits and alternatives for the proposed anesthesia with the patient or authorized representative who has indicated his/her understanding and acceptance.     Plan Discussed with:   Anesthesia Plan Comments:         Anesthesia Quick Evaluation  

## 2011-12-15 NOTE — Brief Op Note (Signed)
Pre-Op Dx: Cataract OD Post-Op Dx: Cataract, Intraoperative Floppy Iris Syndrome OD Surgeon: Gemma Payor Anesthesia: Topical with MAC Surgery: Cataract Extraction with Intraocular lens Implant OD Implant: B&L enVista Specimen: None Complications: None

## 2011-12-15 NOTE — Transfer of Care (Signed)
Immediate Anesthesia Transfer of Care Note  Patient: Lucas David  Procedure(s) Performed: Procedure(s) (LRB): CATARACT EXTRACTION PHACO AND INTRAOCULAR LENS PLACEMENT (IOC) (Right)  Patient Location: Shortstay  Anesthesia Type: MAC  Level of Consciousness: awake  Airway & Oxygen Therapy: Patient Spontanous Breathing   Post-op Assessment: Report given to PACU RN, Post -op Vital signs reviewed and stable and Patient moving all extremities  Post vital signs: Reviewed and stable  Complications: No apparent anesthesia complications

## 2011-12-15 NOTE — Anesthesia Postprocedure Evaluation (Signed)
  Anesthesia Post-op Note  Patient: Lucas David  Procedure(s) Performed: Procedure(s) (LRB): CATARACT EXTRACTION PHACO AND INTRAOCULAR LENS PLACEMENT (IOC) (Right)  Patient Location:  Short Stay  Anesthesia Type: MAC  Level of Consciousness: awake  Airway and Oxygen Therapy: Patient Spontanous Breathing  Post-op Pain: none  Post-op Assessment: Post-op Vital signs reviewed, Patient's Cardiovascular Status Stable, Respiratory Function Stable, Patent Airway, No signs of Nausea or vomiting and Pain level controlled  Post-op Vital Signs: Reviewed and stable  Complications: No apparent anesthesia complications

## 2011-12-15 NOTE — H&P (Signed)
I have reviewed the H&P, the patient was re-examined, and I have identified no interval changes in medical condition and plan of care since the history and physical of record  

## 2011-12-15 NOTE — Anesthesia Procedure Notes (Signed)
Procedure Name: MAC Date/Time: 12/15/2011 12:04 PM Performed by: Franco Nones Pre-anesthesia Checklist: Patient identified, Emergency Drugs available, Suction available, Timeout performed and Patient being monitored Patient Re-evaluated:Patient Re-evaluated prior to inductionOxygen Delivery Method: Nasal Cannula

## 2011-12-16 NOTE — Op Note (Signed)
NAME:  JALEIL, RENWICK                  ACCOUNT NO.:  [id number]  MEDICAL RECORD NO.:  [id number]  LOCATION:  [facility]                          FACILITY:  [facility]  PHYSICIAN:  Susanne Greenhouse, MD       DATE OF BIRTH:  1926/10/17  DATE OF PROCEDURE:  12/15/2011 DATE OF DISCHARGE:  12/15/2011                              OPERATIVE REPORT   PREOPERATIVE DIAGNOSIS:  Combined cataract, right eye, diagnosis code 366.19.  POSTOPERATIVE DIAGNOSES:  Combined cataract, right eye, diagnosis code 366.19; intraoperative floppy iris syndrome, diagnosis code 364.81.  SURGEON:  Susanne Greenhouse, MD  ANESTHESIA:  Topical with monitored anesthesia care and IV sedation.  SURGERY PERFORMED:  Phacoemulsification, posterior chamber intraocular lens implantation, right eye.  DESCRIPTION OF THE OPERATION:  In the preoperative holding area, dilating drops and viscous lidocaine were placed into the right eye. The patient was then brought to the operating room where he was prepped and draped.  The beginning pupil size was approximately 4.5 mm. Beginning with a 75 blade, a paracentesis port was made at the surgeon's 2 o'clock position.  Anterior chamber was then filled with an 1% nonpreserved lidocaine solution with epinephrine.  The anterior chamber was then filled with Provisc.  At the conclusion of these maneuvers, the pupil dilation was still approximately 5 mm.  The decision was made to use the Malyugin ring.  A 2.4 mm keratome blade was then used to make a clear corneal incision at the temporal limbus.  The Malyugin ring was then inserted into the anterior chamber and the loops engaged with the iris margin.  The trailing loop was positioned with the __________ hook. A bent cystotome needle was used to create a continuous tear capsulotomy.  Hydrodissection was performed with balance salt solution and a fine cannula.  The lens nucleus was then removed using phacoemulsification in a quadrant cracking technique.   Residual cortex was removed with irrigation and aspiration.  The capsular bag and anterior chamber were refilled with Provisc.  The Malyugin ring was then disengaged from the pupillary margin and removed __________ instrument. A posterior chamber intraocular lens was placed into the capsular bag without difficulty using its lens injecting system.  The Provisc was removed from the capsular bag and anterior chamber with irrigation and aspiration.  Stromal hydration of the main incision and paracentesis ports were performed with balanced salt solution on fine cannula.  The wounds were tested for leak which were negative.  The patient tolerated the procedure well.  There were no operative complications and he was returned to the recovery room in satisfactory condition.  No surgical specimens.  Prosthetic device used is [employer] enVista posterior chamber lens, model MX60, power of 19.5, serial number is [id number].          ______________________________ Susanne Greenhouse, MD     KEH/MEDQ  D:  12/15/2011  T:  12/16/2011  Job:  [id number]

## 2011-12-17 ENCOUNTER — Encounter (HOSPITAL_COMMUNITY): Payer: Self-pay | Admitting: Ophthalmology

## 2011-12-22 ENCOUNTER — Encounter (HOSPITAL_COMMUNITY): Payer: Self-pay

## 2011-12-22 ENCOUNTER — Encounter (HOSPITAL_COMMUNITY)
Admission: RE | Admit: 2011-12-22 | Discharge: 2011-12-22 | Payer: Medicare Other | Source: Ambulatory Visit | Attending: Ophthalmology | Admitting: Ophthalmology

## 2011-12-26 MED ORDER — NEOMYCIN-POLYMYXIN-DEXAMETH 3.5-10000-0.1 OP OINT
TOPICAL_OINTMENT | OPHTHALMIC | Status: AC
  Filled 2011-12-26: qty 3.5

## 2011-12-26 MED ORDER — CYCLOPENTOLATE-PHENYLEPHRINE 0.2-1 % OP SOLN
OPHTHALMIC | Status: AC
  Filled 2011-12-26: qty 2

## 2011-12-26 MED ORDER — TETRACAINE HCL 0.5 % OP SOLN
OPHTHALMIC | Status: AC
  Filled 2011-12-26: qty 2

## 2011-12-26 MED ORDER — PHENYLEPHRINE HCL 2.5 % OP SOLN
OPHTHALMIC | Status: AC
  Filled 2011-12-26: qty 2

## 2011-12-26 MED ORDER — LIDOCAINE HCL 3.5 % OP GEL
OPHTHALMIC | Status: AC
Start: 2011-12-26 — End: 2011-12-26
  Filled 2011-12-26: qty 5

## 2011-12-29 ENCOUNTER — Encounter (HOSPITAL_COMMUNITY)
Admission: RE | Disposition: A | Discharge status: Home / Self Care | Payer: Self-pay | Source: Ambulatory Visit | Attending: Ophthalmology

## 2011-12-29 ENCOUNTER — Encounter (HOSPITAL_COMMUNITY): Payer: Self-pay | Admitting: Anesthesiology

## 2011-12-29 ENCOUNTER — Ambulatory Visit (HOSPITAL_COMMUNITY): Payer: Medicare Other | Admitting: Anesthesiology

## 2011-12-29 ENCOUNTER — Ambulatory Visit (HOSPITAL_COMMUNITY)
Admission: RE | Admit: 2011-12-29 | Discharge: 2011-12-29 | Disposition: A | Discharge status: Home / Self Care | Payer: Medicare Other | Source: Ambulatory Visit | Attending: Ophthalmology | Admitting: Ophthalmology

## 2011-12-29 ENCOUNTER — Encounter (HOSPITAL_COMMUNITY): Payer: Self-pay

## 2011-12-29 DIAGNOSIS — Z01812 Encounter for preprocedural laboratory examination: Secondary | ICD-10-CM | POA: Insufficient documentation

## 2011-12-29 DIAGNOSIS — H2510 Age-related nuclear cataract, unspecified eye: Secondary | ICD-10-CM | POA: Insufficient documentation

## 2011-12-29 DIAGNOSIS — E119 Type 2 diabetes mellitus without complications: Secondary | ICD-10-CM | POA: Insufficient documentation

## 2011-12-29 DIAGNOSIS — I10 Essential (primary) hypertension: Secondary | ICD-10-CM | POA: Insufficient documentation

## 2011-12-29 DIAGNOSIS — H2181 Floppy iris syndrome: Secondary | ICD-10-CM | POA: Insufficient documentation

## 2011-12-29 HISTORY — PX: CATARACT EXTRACTION W/PHACO: SHX586

## 2011-12-29 LAB — GLUCOSE, CAPILLARY: Glucose-Capillary: 178 mg/dL — ABNORMAL HIGH (ref 70–99)

## 2011-12-29 SURGERY — PHACOEMULSIFICATION, CATARACT, WITH IOL INSERTION
Anesthesia: Monitor Anesthesia Care | Site: Eye | Laterality: Left | Wound class: Clean

## 2011-12-29 MED ORDER — NEOMYCIN-POLYMYXIN-DEXAMETH 0.1 % OP OINT
TOPICAL_OINTMENT | OPHTHALMIC | Status: DC | PRN
  Administered 2011-12-29: 1 via OPHTHALMIC

## 2011-12-29 MED ORDER — LIDOCAINE HCL (PF) 1 % IJ SOLN: INTRAMUSCULAR | Status: DC | PRN

## 2011-12-29 MED ORDER — TETRACAINE HCL 0.5 % OP SOLN
1.0000 [drp] | OPHTHALMIC | Status: AC
  Administered 2011-12-29 (×3): 1 [drp] via OPHTHALMIC

## 2011-12-29 MED ORDER — LIDOCAINE HCL 3.5 % OP GEL
1.0000 "application " | Freq: Once | OPHTHALMIC | Status: AC
  Administered 2011-12-29: 1 via OPHTHALMIC

## 2011-12-29 MED ORDER — LIDOCAINE HCL (PF) 1 % IJ SOLN
INTRAOCULAR | Status: DC | PRN
  Administered 2011-12-29: 09:00:00 via OPHTHALMIC

## 2011-12-29 MED ORDER — MIDAZOLAM HCL 2 MG/2ML IJ SOLN
1.0000 mg | INTRAMUSCULAR | Status: DC | PRN
  Administered 2011-12-29: 2 mg via INTRAVENOUS

## 2011-12-29 MED ORDER — MIDAZOLAM HCL 2 MG/2ML IJ SOLN
INTRAMUSCULAR | Status: AC
  Filled 2011-12-29: qty 2

## 2011-12-29 MED ORDER — POVIDONE-IODINE 5 % OP SOLN
OPHTHALMIC | Status: DC | PRN
  Administered 2011-12-29: 1 via OPHTHALMIC

## 2011-12-29 MED ORDER — LACTATED RINGERS IV SOLN
INTRAVENOUS | Status: DC
  Administered 2011-12-29: 08:00:00 via INTRAVENOUS

## 2011-12-29 MED ORDER — PROVISC 10 MG/ML IO SOLN
INTRAOCULAR | Status: DC | PRN
  Administered 2011-12-29: 8.5 mg via INTRAOCULAR

## 2011-12-29 MED ORDER — BSS IO SOLN
INTRAOCULAR | Status: DC | PRN
  Administered 2011-12-29: 15 mL via INTRAOCULAR

## 2011-12-29 MED ORDER — PHENYLEPHRINE HCL 2.5 % OP SOLN
1.0000 [drp] | OPHTHALMIC | Status: AC
  Administered 2011-12-29 (×3): 1 [drp] via OPHTHALMIC

## 2011-12-29 MED ORDER — CYCLOPENTOLATE-PHENYLEPHRINE 0.2-1 % OP SOLN
1.0000 [drp] | OPHTHALMIC | Status: AC
  Administered 2011-12-29 (×3): 1 [drp] via OPHTHALMIC

## 2011-12-29 MED ORDER — EPINEPHRINE HCL 1 MG/ML IJ SOLN
INTRAOCULAR | Status: DC | PRN
  Administered 2011-12-29: 09:00:00

## 2011-12-29 MED ORDER — CYCLOPENTOLATE HCL 1 % OP SOLN: 1.0000 [drp] | OPHTHALMIC | Status: AC

## 2011-12-29 SURGICAL SUPPLY — 31 items
CAPSULAR TENSION RING-AMO (OPHTHALMIC RELATED) IMPLANT
CLOTH BEACON ORANGE TIMEOUT ST (SAFETY) ×1 IMPLANT
EYE SHIELD UNIVERSAL CLEAR (GAUZE/BANDAGES/DRESSINGS) ×1 IMPLANT
GLOVE BIO SURGEON STRL SZ 6.5 (GLOVE) IMPLANT
GLOVE BIOGEL PI IND STRL 6.5 (GLOVE) IMPLANT
GLOVE BIOGEL PI IND STRL 7.0 (GLOVE) IMPLANT
GLOVE BIOGEL PI IND STRL 7.5 (GLOVE) IMPLANT
GLOVE BIOGEL PI INDICATOR 6.5 (GLOVE) ×1
GLOVE BIOGEL PI INDICATOR 7.0 (GLOVE)
GLOVE BIOGEL PI INDICATOR 7.5 (GLOVE)
GLOVE ECLIPSE 6.5 STRL STRAW (GLOVE) IMPLANT
GLOVE ECLIPSE 7.0 STRL STRAW (GLOVE) IMPLANT
GLOVE ECLIPSE 7.5 STRL STRAW (GLOVE) IMPLANT
GLOVE EXAM NITRILE LRG STRL (GLOVE) IMPLANT
GLOVE EXAM NITRILE MD LF STRL (GLOVE) ×1 IMPLANT
GLOVE SKINSENSE NS SZ6.5 (GLOVE)
GLOVE SKINSENSE NS SZ7.0 (GLOVE)
GLOVE SKINSENSE STRL SZ6.5 (GLOVE) IMPLANT
GLOVE SKINSENSE STRL SZ7.0 (GLOVE) IMPLANT
KIT VITRECTOMY (OPHTHALMIC RELATED) IMPLANT
PAD ARMBOARD 7.5X6 YLW CONV (MISCELLANEOUS) ×1 IMPLANT
PROC W NO LENS (INTRAOCULAR LENS)
PROC W SPEC LENS (INTRAOCULAR LENS)
PROCESS W NO LENS (INTRAOCULAR LENS) IMPLANT
PROCESS W SPEC LENS (INTRAOCULAR LENS) IMPLANT
RING MALYGIN (MISCELLANEOUS) ×1 IMPLANT
SIGHTPATH CAT PROC W REG LENS (Ophthalmic Related) ×2 IMPLANT
SYR TB 1ML LL NO SAFETY (SYRINGE) ×1 IMPLANT
TAPE CLOTH SOFT 2X10 (GAUZE/BANDAGES/DRESSINGS) ×1 IMPLANT
VISCOELASTIC ADDITIONAL (OPHTHALMIC RELATED) IMPLANT
WATER STERILE IRR 250ML POUR (IV SOLUTION) ×1 IMPLANT

## 2011-12-29 NOTE — Anesthesia Preprocedure Evaluation (Signed)
Anesthesia Evaluation  Patient identified by MRN, date of birth, ID band Patient awake    Reviewed: Allergy & Precautions, H&P , NPO status , Patient's Chart, lab work & pertinent test results  Airway Mallampati: II      Dental  (+) Teeth Intact   Pulmonary neg pulmonary ROS,  breath sounds clear to auscultation        Cardiovascular hypertension, Pt. on medications Rhythm:Regular Rate:Bradycardia     Neuro/Psych    GI/Hepatic   Endo/Other  diabetes, Well Controlled, Type 2, Oral Hypoglycemic Agents  Renal/GU      Musculoskeletal   Abdominal   Peds  Hematology   Anesthesia Other Findings   Reproductive/Obstetrics                           Anesthesia Physical Anesthesia Plan  ASA: III  Anesthesia Plan: MAC   Post-op Pain Management:    Induction: Intravenous  Airway Management Planned: Nasal Cannula  Additional Equipment:   Intra-op Plan:   Post-operative Plan:   Informed Consent: I have reviewed the patients History and Physical, chart, labs and discussed the procedure including the risks, benefits and alternatives for the proposed anesthesia with the patient or authorized representative who has indicated his/her understanding and acceptance.     Plan Discussed with:   Anesthesia Plan Comments:         Anesthesia Quick Evaluation

## 2011-12-29 NOTE — H&P (Signed)
I have reviewed the H&P, the patient was re-examined, and I have identified no interval changes in medical condition and plan of care since the history and physical of record  

## 2011-12-29 NOTE — Transfer of Care (Signed)
Immediate Anesthesia Transfer of Care Note  Patient: Lucas David  Procedure(s) Performed: Procedure(s) (LRB) with comments: CATARACT EXTRACTION PHACO AND INTRAOCULAR LENS PLACEMENT (IOC) (Left) - CDE:24.13  Patient Location: PACU and Short Stay  Anesthesia Type:MAC  Level of Consciousness: awake, alert , oriented and patient cooperative  Airway & Oxygen Therapy: Patient Spontanous Breathing  Post-op Assessment: Report given to PACU RN, Post -op Vital signs reviewed and stable and Patient moving all extremities  Post vital signs: Reviewed and stable  Complications: No apparent anesthesia complications

## 2011-12-29 NOTE — Brief Op Note (Signed)
Pre-Op Dx: Cataract OS Post-Op Dx: Cataract OS Surgeon: Avory Mimbs Anesthesia: Topical with MAC Surgery: Cataract Extraction with Intraocular lens Implant OS Implant: B&L enVista Specimen: None Complications: None 

## 2011-12-29 NOTE — Anesthesia Postprocedure Evaluation (Signed)
  Anesthesia Post-op Note  Patient: Lucas David  Procedure(s) Performed: Procedure(s) (LRB) with comments: CATARACT EXTRACTION PHACO AND INTRAOCULAR LENS PLACEMENT (IOC) (Left) - CDE:24.13  Patient Location: PACU and Short Stay  Anesthesia Type: MAC   Level of Consciousness: awake, alert , oriented and patient cooperative  Airway and Oxygen Therapy: Patient Spontanous Breathing  Post-op Pain: none  Post-op Assessment: Post-op Vital signs reviewed, Patient's Cardiovascular Status Stable, Respiratory Function Stable, Patent Airway, No signs of Nausea or vomiting and Pain level controlled  Post-op Vital Signs: Reviewed and stable  Complications: No apparent anesthesia complications

## 2011-12-30 NOTE — Op Note (Signed)
NAME:  Lucas David, Lucas David                  ACCOUNT NO.:  [id number]  MEDICAL RECORD NO.:  [id number]  LOCATION:  [facility]                          FACILITY:  [facility]  PHYSICIAN:  Susanne Greenhouse, MD       DATE OF BIRTH:  1926/12/26  DATE OF PROCEDURE:  12/29/2011 DATE OF DISCHARGE:  12/29/2011                              OPERATIVE REPORT   PREOPERATIVE DIAGNOSIS:  Nuclear cataract, left eye, diagnosis code 366.16.  POSTOPERATIVE DIAGNOSES:  Nuclear cataract, left eye, diagnosis code 366.16; intraoperative floppy iris syndrome, diagnosis code 364.81.  SURGEON:  Susanne Greenhouse, MD  ANESTHESIA:  Topical with monitored anesthesia care and IV sedation.  PROCEDURE PERFORMED:  Phacoemulsification, posterior chamber intraocular lens implantation, left eye.  INDICATIONS:  Painless progressive loss of vision, left eye.  DESCRIPTION OF THE OPERATION:  In the preoperative holding area, dilating drops and viscous lidocaine were placed into the left eye.  The patient was brought to the operating room, where he was prepped and draped.  Beginning with a 75 blade, a paracentesis port was made at the surgeon's 2 o'clock position.  The anterior chamber was then filled with a 1% nonpreserved lidocaine solution with epinephrine.  The starting pupil size was approximately 4.0 mm and with the epinephrine, the pupil dilated to approximately 4.5 mm.  The anterior chamber was then filled with Provisc and a 2.4 mm keratome blade was used to make the clear corneal incision at the temporal limbus.  Decision was made to place a Malyugin ring to hold the pupil dilated and this was placed into the anterior chamber engaged into the pupillary margin.  A bent cystotome needle and Utrata forceps were used to create a continuous tear capsulotomy.  Hydrodissection was then performed using balanced salt solution on a fine cannula.  The lens nucleus was removed using phacoemulsification and a quadrant cracking technique.  Residual  cortex was removed with irrigation and aspiration.  The capsular bag and anterior chamber were refilled with Provisc and a posterior chamber intraocular lens was placed into the capsular bag using lens injecting system.  Malyugin ring was then disengaged from the pupillary margin and removed with injector.  Provisc was removed from the capsular bag and anterior chamber with the irrigation and aspiration handpiece.  Stromal hydration of the main incision and paracentesis ports were performed with balanced salt solution on a fine cannula.  The wounds were tested for leak which were negative.  The patient tolerated the procedure well. There were no operative complications and he was returned to the recovery room in satisfactory condition.  No surgical specimens.  Prosthetic device used is a [employer] enVista posterior chamber lens, model MX60, power of 20.0, serial number is [id number].          ______________________________ Susanne Greenhouse, MD     KEH/MEDQ  D:  12/29/2011  T:  12/30/2011  Job:  [id number]

## 2011-12-31 ENCOUNTER — Encounter (HOSPITAL_COMMUNITY): Payer: Self-pay | Admitting: Ophthalmology

## 2014-10-02 ENCOUNTER — Other Ambulatory Visit: Payer: Self-pay | Admitting: Otolaryngology

## 2015-03-08 DIAGNOSIS — E1140 Type 2 diabetes mellitus with diabetic neuropathy, unspecified: Secondary | ICD-10-CM | POA: Diagnosis not present

## 2015-03-08 DIAGNOSIS — L110 Acquired keratosis follicularis: Secondary | ICD-10-CM | POA: Diagnosis not present

## 2015-03-08 DIAGNOSIS — B351 Tinea unguium: Secondary | ICD-10-CM | POA: Diagnosis not present

## 2015-03-08 DIAGNOSIS — M79674 Pain in right toe(s): Secondary | ICD-10-CM | POA: Diagnosis not present

## 2015-05-17 DIAGNOSIS — L110 Acquired keratosis follicularis: Secondary | ICD-10-CM | POA: Diagnosis not present

## 2015-05-17 DIAGNOSIS — E1140 Type 2 diabetes mellitus with diabetic neuropathy, unspecified: Secondary | ICD-10-CM | POA: Diagnosis not present

## 2015-05-17 DIAGNOSIS — B351 Tinea unguium: Secondary | ICD-10-CM | POA: Diagnosis not present

## 2015-05-18 DIAGNOSIS — I10 Essential (primary) hypertension: Secondary | ICD-10-CM | POA: Diagnosis not present

## 2015-05-18 DIAGNOSIS — E119 Type 2 diabetes mellitus without complications: Secondary | ICD-10-CM | POA: Diagnosis not present

## 2015-05-18 DIAGNOSIS — E7800 Pure hypercholesterolemia, unspecified: Secondary | ICD-10-CM | POA: Diagnosis not present

## 2015-05-21 DIAGNOSIS — E7800 Pure hypercholesterolemia, unspecified: Secondary | ICD-10-CM | POA: Diagnosis not present

## 2015-05-21 DIAGNOSIS — Z125 Encounter for screening for malignant neoplasm of prostate: Secondary | ICD-10-CM | POA: Diagnosis not present

## 2015-05-21 DIAGNOSIS — Z0000 Encounter for general adult medical examination without abnormal findings: Secondary | ICD-10-CM | POA: Diagnosis not present

## 2015-05-21 DIAGNOSIS — Z6835 Body mass index (BMI) 35.0-35.9, adult: Secondary | ICD-10-CM | POA: Diagnosis not present

## 2015-05-21 DIAGNOSIS — Z79899 Other long term (current) drug therapy: Secondary | ICD-10-CM | POA: Diagnosis not present

## 2015-05-21 DIAGNOSIS — Z299 Encounter for prophylactic measures, unspecified: Secondary | ICD-10-CM | POA: Diagnosis not present

## 2015-05-21 DIAGNOSIS — R5383 Other fatigue: Secondary | ICD-10-CM | POA: Diagnosis not present

## 2015-05-21 DIAGNOSIS — Z1389 Encounter for screening for other disorder: Secondary | ICD-10-CM | POA: Diagnosis not present

## 2015-05-21 DIAGNOSIS — Z1211 Encounter for screening for malignant neoplasm of colon: Secondary | ICD-10-CM | POA: Diagnosis not present

## 2015-05-21 DIAGNOSIS — Z7189 Other specified counseling: Secondary | ICD-10-CM | POA: Diagnosis not present

## 2015-06-11 DIAGNOSIS — L089 Local infection of the skin and subcutaneous tissue, unspecified: Secondary | ICD-10-CM | POA: Diagnosis not present

## 2015-06-11 DIAGNOSIS — L723 Sebaceous cyst: Secondary | ICD-10-CM | POA: Diagnosis not present

## 2015-06-21 DIAGNOSIS — E1122 Type 2 diabetes mellitus with diabetic chronic kidney disease: Secondary | ICD-10-CM | POA: Diagnosis not present

## 2015-06-21 DIAGNOSIS — Z789 Other specified health status: Secondary | ICD-10-CM | POA: Diagnosis not present

## 2015-06-21 DIAGNOSIS — I10 Essential (primary) hypertension: Secondary | ICD-10-CM | POA: Diagnosis not present

## 2015-07-26 DIAGNOSIS — E1140 Type 2 diabetes mellitus with diabetic neuropathy, unspecified: Secondary | ICD-10-CM | POA: Diagnosis not present

## 2015-07-26 DIAGNOSIS — B351 Tinea unguium: Secondary | ICD-10-CM | POA: Diagnosis not present

## 2015-07-26 DIAGNOSIS — L110 Acquired keratosis follicularis: Secondary | ICD-10-CM | POA: Diagnosis not present

## 2015-08-16 DIAGNOSIS — N401 Enlarged prostate with lower urinary tract symptoms: Secondary | ICD-10-CM | POA: Diagnosis not present

## 2015-08-16 DIAGNOSIS — R972 Elevated prostate specific antigen [PSA]: Secondary | ICD-10-CM | POA: Diagnosis not present

## 2015-08-16 DIAGNOSIS — R3915 Urgency of urination: Secondary | ICD-10-CM | POA: Diagnosis not present

## 2015-08-16 DIAGNOSIS — R32 Unspecified urinary incontinence: Secondary | ICD-10-CM | POA: Diagnosis not present

## 2015-08-16 DIAGNOSIS — R350 Frequency of micturition: Secondary | ICD-10-CM | POA: Diagnosis not present

## 2015-08-16 DIAGNOSIS — C61 Malignant neoplasm of prostate: Secondary | ICD-10-CM | POA: Diagnosis not present

## 2015-09-06 DIAGNOSIS — H353110 Nonexudative age-related macular degeneration, right eye, stage unspecified: Secondary | ICD-10-CM | POA: Diagnosis not present

## 2015-09-28 DIAGNOSIS — E1122 Type 2 diabetes mellitus with diabetic chronic kidney disease: Secondary | ICD-10-CM | POA: Diagnosis not present

## 2015-09-28 DIAGNOSIS — E7800 Pure hypercholesterolemia, unspecified: Secondary | ICD-10-CM | POA: Diagnosis not present

## 2015-09-28 DIAGNOSIS — M1990 Unspecified osteoarthritis, unspecified site: Secondary | ICD-10-CM | POA: Diagnosis not present

## 2015-10-10 DIAGNOSIS — B351 Tinea unguium: Secondary | ICD-10-CM | POA: Diagnosis not present

## 2015-10-10 DIAGNOSIS — E1140 Type 2 diabetes mellitus with diabetic neuropathy, unspecified: Secondary | ICD-10-CM | POA: Diagnosis not present

## 2015-10-10 DIAGNOSIS — L110 Acquired keratosis follicularis: Secondary | ICD-10-CM | POA: Diagnosis not present

## 2015-12-03 DIAGNOSIS — Z961 Presence of intraocular lens: Secondary | ICD-10-CM | POA: Diagnosis not present

## 2015-12-03 DIAGNOSIS — H26491 Other secondary cataract, right eye: Secondary | ICD-10-CM | POA: Diagnosis not present

## 2015-12-03 DIAGNOSIS — H353132 Nonexudative age-related macular degeneration, bilateral, intermediate dry stage: Secondary | ICD-10-CM | POA: Diagnosis not present

## 2015-12-21 DIAGNOSIS — E7800 Pure hypercholesterolemia, unspecified: Secondary | ICD-10-CM | POA: Diagnosis not present

## 2015-12-21 DIAGNOSIS — I10 Essential (primary) hypertension: Secondary | ICD-10-CM | POA: Diagnosis not present

## 2015-12-21 DIAGNOSIS — E119 Type 2 diabetes mellitus without complications: Secondary | ICD-10-CM | POA: Diagnosis not present

## 2015-12-24 DIAGNOSIS — N400 Enlarged prostate without lower urinary tract symptoms: Secondary | ICD-10-CM | POA: Diagnosis not present

## 2015-12-24 DIAGNOSIS — E860 Dehydration: Secondary | ICD-10-CM | POA: Diagnosis present

## 2015-12-24 DIAGNOSIS — I2699 Other pulmonary embolism without acute cor pulmonale: Secondary | ICD-10-CM | POA: Diagnosis present

## 2015-12-24 DIAGNOSIS — Z23 Encounter for immunization: Secondary | ICD-10-CM | POA: Diagnosis not present

## 2015-12-24 DIAGNOSIS — Z888 Allergy status to other drugs, medicaments and biological substances status: Secondary | ICD-10-CM | POA: Diagnosis not present

## 2015-12-24 DIAGNOSIS — T380X5A Adverse effect of glucocorticoids and synthetic analogues, initial encounter: Secondary | ICD-10-CM | POA: Diagnosis present

## 2015-12-24 DIAGNOSIS — Z7984 Long term (current) use of oral hypoglycemic drugs: Secondary | ICD-10-CM | POA: Diagnosis not present

## 2015-12-24 DIAGNOSIS — R600 Localized edema: Secondary | ICD-10-CM | POA: Diagnosis not present

## 2015-12-24 DIAGNOSIS — F329 Major depressive disorder, single episode, unspecified: Secondary | ICD-10-CM | POA: Diagnosis not present

## 2015-12-24 DIAGNOSIS — Z8249 Family history of ischemic heart disease and other diseases of the circulatory system: Secondary | ICD-10-CM | POA: Diagnosis not present

## 2015-12-24 DIAGNOSIS — Z79899 Other long term (current) drug therapy: Secondary | ICD-10-CM | POA: Diagnosis not present

## 2015-12-24 DIAGNOSIS — E785 Hyperlipidemia, unspecified: Secondary | ICD-10-CM | POA: Diagnosis present

## 2015-12-24 DIAGNOSIS — I10 Essential (primary) hypertension: Secondary | ICD-10-CM | POA: Diagnosis present

## 2015-12-24 DIAGNOSIS — R0602 Shortness of breath: Secondary | ICD-10-CM | POA: Diagnosis not present

## 2015-12-24 DIAGNOSIS — E1165 Type 2 diabetes mellitus with hyperglycemia: Secondary | ICD-10-CM | POA: Diagnosis present

## 2015-12-24 DIAGNOSIS — M1990 Unspecified osteoarthritis, unspecified site: Secondary | ICD-10-CM | POA: Diagnosis present

## 2015-12-24 DIAGNOSIS — R278 Other lack of coordination: Secondary | ICD-10-CM | POA: Diagnosis not present

## 2015-12-24 DIAGNOSIS — R0902 Hypoxemia: Secondary | ICD-10-CM | POA: Diagnosis present

## 2015-12-24 DIAGNOSIS — E119 Type 2 diabetes mellitus without complications: Secondary | ICD-10-CM | POA: Diagnosis not present

## 2015-12-24 DIAGNOSIS — M6281 Muscle weakness (generalized): Secondary | ICD-10-CM | POA: Diagnosis not present

## 2015-12-24 DIAGNOSIS — R911 Solitary pulmonary nodule: Secondary | ICD-10-CM | POA: Diagnosis present

## 2015-12-24 DIAGNOSIS — R0603 Acute respiratory distress: Secondary | ICD-10-CM | POA: Diagnosis present

## 2015-12-24 DIAGNOSIS — Z8546 Personal history of malignant neoplasm of prostate: Secondary | ICD-10-CM | POA: Diagnosis not present

## 2015-12-28 DIAGNOSIS — R911 Solitary pulmonary nodule: Secondary | ICD-10-CM | POA: Diagnosis not present

## 2015-12-28 DIAGNOSIS — S8011XA Contusion of right lower leg, initial encounter: Secondary | ICD-10-CM | POA: Diagnosis not present

## 2015-12-28 DIAGNOSIS — I639 Cerebral infarction, unspecified: Secondary | ICD-10-CM | POA: Diagnosis not present

## 2015-12-28 DIAGNOSIS — F329 Major depressive disorder, single episode, unspecified: Secondary | ICD-10-CM | POA: Diagnosis not present

## 2015-12-28 DIAGNOSIS — E119 Type 2 diabetes mellitus without complications: Secondary | ICD-10-CM | POA: Diagnosis not present

## 2015-12-28 DIAGNOSIS — R0603 Acute respiratory distress: Secondary | ICD-10-CM | POA: Diagnosis not present

## 2015-12-28 DIAGNOSIS — R278 Other lack of coordination: Secondary | ICD-10-CM | POA: Diagnosis not present

## 2015-12-28 DIAGNOSIS — M6281 Muscle weakness (generalized): Secondary | ICD-10-CM | POA: Diagnosis not present

## 2015-12-28 DIAGNOSIS — R0902 Hypoxemia: Secondary | ICD-10-CM | POA: Diagnosis not present

## 2015-12-28 DIAGNOSIS — N400 Enlarged prostate without lower urinary tract symptoms: Secondary | ICD-10-CM | POA: Diagnosis not present

## 2015-12-28 DIAGNOSIS — M7120 Synovial cyst of popliteal space [Baker], unspecified knee: Secondary | ICD-10-CM | POA: Diagnosis not present

## 2015-12-28 DIAGNOSIS — R233 Spontaneous ecchymoses: Secondary | ICD-10-CM | POA: Diagnosis not present

## 2015-12-28 DIAGNOSIS — R0602 Shortness of breath: Secondary | ICD-10-CM | POA: Diagnosis not present

## 2015-12-28 DIAGNOSIS — I10 Essential (primary) hypertension: Secondary | ICD-10-CM | POA: Diagnosis not present

## 2015-12-28 DIAGNOSIS — I2699 Other pulmonary embolism without acute cor pulmonale: Secondary | ICD-10-CM | POA: Diagnosis not present

## 2015-12-28 DIAGNOSIS — E1165 Type 2 diabetes mellitus with hyperglycemia: Secondary | ICD-10-CM | POA: Diagnosis not present

## 2015-12-31 DIAGNOSIS — I10 Essential (primary) hypertension: Secondary | ICD-10-CM | POA: Diagnosis not present

## 2015-12-31 DIAGNOSIS — M7120 Synovial cyst of popliteal space [Baker], unspecified knee: Secondary | ICD-10-CM | POA: Diagnosis not present

## 2015-12-31 DIAGNOSIS — E1165 Type 2 diabetes mellitus with hyperglycemia: Secondary | ICD-10-CM | POA: Diagnosis not present

## 2015-12-31 DIAGNOSIS — I639 Cerebral infarction, unspecified: Secondary | ICD-10-CM | POA: Diagnosis not present

## 2016-01-03 ENCOUNTER — Other Ambulatory Visit (HOSPITAL_COMMUNITY): Payer: Self-pay | Admitting: Internal Medicine

## 2016-01-03 DIAGNOSIS — R911 Solitary pulmonary nodule: Secondary | ICD-10-CM

## 2016-01-05 DIAGNOSIS — S8011XA Contusion of right lower leg, initial encounter: Secondary | ICD-10-CM | POA: Diagnosis not present

## 2016-01-05 DIAGNOSIS — I10 Essential (primary) hypertension: Secondary | ICD-10-CM | POA: Diagnosis not present

## 2016-01-08 DIAGNOSIS — R233 Spontaneous ecchymoses: Secondary | ICD-10-CM | POA: Diagnosis not present

## 2016-01-08 DIAGNOSIS — E119 Type 2 diabetes mellitus without complications: Secondary | ICD-10-CM | POA: Diagnosis not present

## 2016-01-08 DIAGNOSIS — I2699 Other pulmonary embolism without acute cor pulmonale: Secondary | ICD-10-CM | POA: Diagnosis not present

## 2016-01-10 DIAGNOSIS — E119 Type 2 diabetes mellitus without complications: Secondary | ICD-10-CM | POA: Diagnosis not present

## 2016-01-10 DIAGNOSIS — I10 Essential (primary) hypertension: Secondary | ICD-10-CM | POA: Diagnosis not present

## 2016-01-10 DIAGNOSIS — I2699 Other pulmonary embolism without acute cor pulmonale: Secondary | ICD-10-CM | POA: Diagnosis not present

## 2016-01-14 ENCOUNTER — Encounter (HOSPITAL_COMMUNITY)
Admission: RE | Admit: 2016-01-14 | Discharge: 2016-01-14 | Disposition: A | Discharge status: Home / Self Care | Payer: Medicare Other | Source: Ambulatory Visit | Attending: Internal Medicine | Admitting: Internal Medicine

## 2016-01-14 ENCOUNTER — Encounter (HOSPITAL_COMMUNITY): Payer: Self-pay

## 2016-01-14 DIAGNOSIS — R911 Solitary pulmonary nodule: Secondary | ICD-10-CM | POA: Insufficient documentation

## 2016-01-15 ENCOUNTER — Encounter (HOSPITAL_COMMUNITY)
Admission: RE | Admit: 2016-01-15 | Discharge: 2016-01-15 | Disposition: A | Discharge status: Home / Self Care | Payer: Medicare Other | Source: Ambulatory Visit | Attending: Internal Medicine | Admitting: Internal Medicine

## 2016-01-15 DIAGNOSIS — R911 Solitary pulmonary nodule: Secondary | ICD-10-CM | POA: Insufficient documentation

## 2016-01-15 LAB — GLUCOSE, CAPILLARY: GLUCOSE-CAPILLARY: 125 mg/dL — AB (ref 65–99)

## 2016-01-15 MED ORDER — FLUDEOXYGLUCOSE F - 18 (FDG) INJECTION
10.3600 | Freq: Once | INTRAVENOUS | Status: AC | PRN
  Administered 2016-01-15: 10.36 via INTRAVENOUS

## 2016-01-22 DIAGNOSIS — Z299 Encounter for prophylactic measures, unspecified: Secondary | ICD-10-CM | POA: Diagnosis not present

## 2016-01-22 DIAGNOSIS — I2699 Other pulmonary embolism without acute cor pulmonale: Secondary | ICD-10-CM | POA: Diagnosis not present

## 2016-01-22 DIAGNOSIS — M1990 Unspecified osteoarthritis, unspecified site: Secondary | ICD-10-CM | POA: Diagnosis not present

## 2016-01-22 DIAGNOSIS — E1122 Type 2 diabetes mellitus with diabetic chronic kidney disease: Secondary | ICD-10-CM | POA: Diagnosis not present

## 2016-01-25 DIAGNOSIS — S0990XA Unspecified injury of head, initial encounter: Secondary | ICD-10-CM | POA: Diagnosis not present

## 2016-01-25 DIAGNOSIS — J342 Deviated nasal septum: Secondary | ICD-10-CM | POA: Diagnosis not present

## 2016-01-25 DIAGNOSIS — G319 Degenerative disease of nervous system, unspecified: Secondary | ICD-10-CM | POA: Diagnosis not present

## 2016-01-25 DIAGNOSIS — R51 Headache: Secondary | ICD-10-CM | POA: Diagnosis not present

## 2016-01-25 DIAGNOSIS — K116 Mucocele of salivary gland: Secondary | ICD-10-CM | POA: Diagnosis not present

## 2016-01-25 DIAGNOSIS — R42 Dizziness and giddiness: Secondary | ICD-10-CM | POA: Diagnosis not present

## 2016-01-28 DIAGNOSIS — Z961 Presence of intraocular lens: Secondary | ICD-10-CM | POA: Diagnosis not present

## 2016-01-30 DIAGNOSIS — E1140 Type 2 diabetes mellitus with diabetic neuropathy, unspecified: Secondary | ICD-10-CM | POA: Diagnosis not present

## 2016-01-30 DIAGNOSIS — L110 Acquired keratosis follicularis: Secondary | ICD-10-CM | POA: Diagnosis not present

## 2016-01-30 DIAGNOSIS — B351 Tinea unguium: Secondary | ICD-10-CM | POA: Diagnosis not present

## 2016-02-05 DIAGNOSIS — Z299 Encounter for prophylactic measures, unspecified: Secondary | ICD-10-CM | POA: Diagnosis not present

## 2016-02-05 DIAGNOSIS — E1122 Type 2 diabetes mellitus with diabetic chronic kidney disease: Secondary | ICD-10-CM | POA: Diagnosis not present

## 2016-02-05 DIAGNOSIS — I10 Essential (primary) hypertension: Secondary | ICD-10-CM | POA: Diagnosis not present

## 2016-02-06 DIAGNOSIS — R296 Repeated falls: Secondary | ICD-10-CM | POA: Diagnosis not present

## 2016-02-06 DIAGNOSIS — R2681 Unsteadiness on feet: Secondary | ICD-10-CM | POA: Diagnosis not present

## 2016-02-21 DIAGNOSIS — R3915 Urgency of urination: Secondary | ICD-10-CM | POA: Diagnosis not present

## 2016-02-21 DIAGNOSIS — R32 Unspecified urinary incontinence: Secondary | ICD-10-CM | POA: Diagnosis not present

## 2016-02-21 DIAGNOSIS — C61 Malignant neoplasm of prostate: Secondary | ICD-10-CM | POA: Diagnosis not present

## 2016-02-21 DIAGNOSIS — N401 Enlarged prostate with lower urinary tract symptoms: Secondary | ICD-10-CM | POA: Diagnosis not present

## 2016-02-21 DIAGNOSIS — R350 Frequency of micturition: Secondary | ICD-10-CM | POA: Diagnosis not present

## 2016-03-12 DIAGNOSIS — C61 Malignant neoplasm of prostate: Secondary | ICD-10-CM | POA: Diagnosis not present

## 2016-03-17 DIAGNOSIS — E7800 Pure hypercholesterolemia, unspecified: Secondary | ICD-10-CM | POA: Diagnosis not present

## 2016-03-17 DIAGNOSIS — I10 Essential (primary) hypertension: Secondary | ICD-10-CM | POA: Diagnosis not present

## 2016-03-17 DIAGNOSIS — E119 Type 2 diabetes mellitus without complications: Secondary | ICD-10-CM | POA: Diagnosis not present

## 2016-03-24 DIAGNOSIS — L570 Actinic keratosis: Secondary | ICD-10-CM | POA: Diagnosis not present

## 2016-03-24 DIAGNOSIS — X32XXXD Exposure to sunlight, subsequent encounter: Secondary | ICD-10-CM | POA: Diagnosis not present

## 2016-03-24 DIAGNOSIS — D0439 Carcinoma in situ of skin of other parts of face: Secondary | ICD-10-CM | POA: Diagnosis not present

## 2016-04-02 DIAGNOSIS — Z713 Dietary counseling and surveillance: Secondary | ICD-10-CM | POA: Diagnosis not present

## 2016-04-02 DIAGNOSIS — I2699 Other pulmonary embolism without acute cor pulmonale: Secondary | ICD-10-CM | POA: Diagnosis not present

## 2016-04-02 DIAGNOSIS — Z6833 Body mass index (BMI) 33.0-33.9, adult: Secondary | ICD-10-CM | POA: Diagnosis not present

## 2016-04-02 DIAGNOSIS — M1990 Unspecified osteoarthritis, unspecified site: Secondary | ICD-10-CM | POA: Diagnosis not present

## 2016-04-02 DIAGNOSIS — J069 Acute upper respiratory infection, unspecified: Secondary | ICD-10-CM | POA: Diagnosis not present

## 2016-04-02 DIAGNOSIS — Z299 Encounter for prophylactic measures, unspecified: Secondary | ICD-10-CM | POA: Diagnosis not present

## 2016-04-02 DIAGNOSIS — E1122 Type 2 diabetes mellitus with diabetic chronic kidney disease: Secondary | ICD-10-CM | POA: Diagnosis not present

## 2016-04-11 DIAGNOSIS — E119 Type 2 diabetes mellitus without complications: Secondary | ICD-10-CM | POA: Diagnosis not present

## 2016-04-11 DIAGNOSIS — I10 Essential (primary) hypertension: Secondary | ICD-10-CM | POA: Diagnosis not present

## 2016-04-11 DIAGNOSIS — E7800 Pure hypercholesterolemia, unspecified: Secondary | ICD-10-CM | POA: Diagnosis not present

## 2016-04-16 DIAGNOSIS — B351 Tinea unguium: Secondary | ICD-10-CM | POA: Diagnosis not present

## 2016-04-16 DIAGNOSIS — E1140 Type 2 diabetes mellitus with diabetic neuropathy, unspecified: Secondary | ICD-10-CM | POA: Diagnosis not present

## 2016-04-16 DIAGNOSIS — L110 Acquired keratosis follicularis: Secondary | ICD-10-CM | POA: Diagnosis not present

## 2016-05-05 DIAGNOSIS — X32XXXD Exposure to sunlight, subsequent encounter: Secondary | ICD-10-CM | POA: Diagnosis not present

## 2016-05-05 DIAGNOSIS — Z85828 Personal history of other malignant neoplasm of skin: Secondary | ICD-10-CM | POA: Diagnosis not present

## 2016-05-05 DIAGNOSIS — Z08 Encounter for follow-up examination after completed treatment for malignant neoplasm: Secondary | ICD-10-CM | POA: Diagnosis not present

## 2016-05-05 DIAGNOSIS — L570 Actinic keratosis: Secondary | ICD-10-CM | POA: Diagnosis not present

## 2016-05-09 DIAGNOSIS — I10 Essential (primary) hypertension: Secondary | ICD-10-CM | POA: Diagnosis not present

## 2016-05-09 DIAGNOSIS — E119 Type 2 diabetes mellitus without complications: Secondary | ICD-10-CM | POA: Diagnosis not present

## 2016-05-09 DIAGNOSIS — E7800 Pure hypercholesterolemia, unspecified: Secondary | ICD-10-CM | POA: Diagnosis not present

## 2016-05-14 DIAGNOSIS — E669 Obesity, unspecified: Secondary | ICD-10-CM | POA: Diagnosis not present

## 2016-05-14 DIAGNOSIS — R972 Elevated prostate specific antigen [PSA]: Secondary | ICD-10-CM | POA: Diagnosis not present

## 2016-05-14 DIAGNOSIS — E782 Mixed hyperlipidemia: Secondary | ICD-10-CM | POA: Diagnosis not present

## 2016-05-14 DIAGNOSIS — Z713 Dietary counseling and surveillance: Secondary | ICD-10-CM | POA: Diagnosis not present

## 2016-05-14 DIAGNOSIS — J069 Acute upper respiratory infection, unspecified: Secondary | ICD-10-CM | POA: Diagnosis not present

## 2016-05-14 DIAGNOSIS — I10 Essential (primary) hypertension: Secondary | ICD-10-CM | POA: Diagnosis not present

## 2016-05-14 DIAGNOSIS — I2699 Other pulmonary embolism without acute cor pulmonale: Secondary | ICD-10-CM | POA: Diagnosis not present

## 2016-05-14 DIAGNOSIS — N183 Chronic kidney disease, stage 3 (moderate): Secondary | ICD-10-CM | POA: Diagnosis not present

## 2016-05-14 DIAGNOSIS — Z299 Encounter for prophylactic measures, unspecified: Secondary | ICD-10-CM | POA: Diagnosis not present

## 2016-05-14 DIAGNOSIS — E1122 Type 2 diabetes mellitus with diabetic chronic kidney disease: Secondary | ICD-10-CM | POA: Diagnosis not present

## 2016-05-14 DIAGNOSIS — L409 Psoriasis, unspecified: Secondary | ICD-10-CM | POA: Diagnosis not present

## 2016-05-14 DIAGNOSIS — I499 Cardiac arrhythmia, unspecified: Secondary | ICD-10-CM | POA: Diagnosis not present

## 2016-05-27 DIAGNOSIS — R5383 Other fatigue: Secondary | ICD-10-CM | POA: Diagnosis not present

## 2016-05-27 DIAGNOSIS — C61 Malignant neoplasm of prostate: Secondary | ICD-10-CM | POA: Diagnosis not present

## 2016-05-27 DIAGNOSIS — Z0000 Encounter for general adult medical examination without abnormal findings: Secondary | ICD-10-CM | POA: Diagnosis not present

## 2016-05-27 DIAGNOSIS — N183 Chronic kidney disease, stage 3 (moderate): Secondary | ICD-10-CM | POA: Diagnosis not present

## 2016-05-27 DIAGNOSIS — Z299 Encounter for prophylactic measures, unspecified: Secondary | ICD-10-CM | POA: Diagnosis not present

## 2016-05-27 DIAGNOSIS — E782 Mixed hyperlipidemia: Secondary | ICD-10-CM | POA: Diagnosis not present

## 2016-05-27 DIAGNOSIS — E1122 Type 2 diabetes mellitus with diabetic chronic kidney disease: Secondary | ICD-10-CM | POA: Diagnosis not present

## 2016-05-27 DIAGNOSIS — Z79899 Other long term (current) drug therapy: Secondary | ICD-10-CM | POA: Diagnosis not present

## 2016-05-27 DIAGNOSIS — Z1389 Encounter for screening for other disorder: Secondary | ICD-10-CM | POA: Diagnosis not present

## 2016-05-27 DIAGNOSIS — I10 Essential (primary) hypertension: Secondary | ICD-10-CM | POA: Diagnosis not present

## 2016-05-27 DIAGNOSIS — Z7189 Other specified counseling: Secondary | ICD-10-CM | POA: Diagnosis not present

## 2016-06-06 DIAGNOSIS — I10 Essential (primary) hypertension: Secondary | ICD-10-CM | POA: Diagnosis not present

## 2016-06-06 DIAGNOSIS — E119 Type 2 diabetes mellitus without complications: Secondary | ICD-10-CM | POA: Diagnosis not present

## 2016-06-06 DIAGNOSIS — E7800 Pure hypercholesterolemia, unspecified: Secondary | ICD-10-CM | POA: Diagnosis not present

## 2016-06-25 DIAGNOSIS — L110 Acquired keratosis follicularis: Secondary | ICD-10-CM | POA: Diagnosis not present

## 2016-06-25 DIAGNOSIS — B351 Tinea unguium: Secondary | ICD-10-CM | POA: Diagnosis not present

## 2016-06-25 DIAGNOSIS — E1140 Type 2 diabetes mellitus with diabetic neuropathy, unspecified: Secondary | ICD-10-CM | POA: Diagnosis not present

## 2016-07-02 DIAGNOSIS — E7800 Pure hypercholesterolemia, unspecified: Secondary | ICD-10-CM | POA: Diagnosis not present

## 2016-07-02 DIAGNOSIS — I10 Essential (primary) hypertension: Secondary | ICD-10-CM | POA: Diagnosis not present

## 2016-07-02 DIAGNOSIS — E119 Type 2 diabetes mellitus without complications: Secondary | ICD-10-CM | POA: Diagnosis not present

## 2016-07-03 DIAGNOSIS — R05 Cough: Secondary | ICD-10-CM | POA: Diagnosis not present

## 2016-07-17 DIAGNOSIS — I10 Essential (primary) hypertension: Secondary | ICD-10-CM | POA: Diagnosis not present

## 2016-07-17 DIAGNOSIS — Z6832 Body mass index (BMI) 32.0-32.9, adult: Secondary | ICD-10-CM | POA: Diagnosis not present

## 2016-07-17 DIAGNOSIS — I2699 Other pulmonary embolism without acute cor pulmonale: Secondary | ICD-10-CM | POA: Diagnosis not present

## 2016-07-17 DIAGNOSIS — N183 Chronic kidney disease, stage 3 (moderate): Secondary | ICD-10-CM | POA: Diagnosis not present

## 2016-07-17 DIAGNOSIS — Z713 Dietary counseling and surveillance: Secondary | ICD-10-CM | POA: Diagnosis not present

## 2016-07-17 DIAGNOSIS — Z299 Encounter for prophylactic measures, unspecified: Secondary | ICD-10-CM | POA: Diagnosis not present

## 2016-07-17 DIAGNOSIS — I499 Cardiac arrhythmia, unspecified: Secondary | ICD-10-CM | POA: Diagnosis not present

## 2016-07-17 DIAGNOSIS — E1122 Type 2 diabetes mellitus with diabetic chronic kidney disease: Secondary | ICD-10-CM | POA: Diagnosis not present

## 2016-07-17 DIAGNOSIS — E782 Mixed hyperlipidemia: Secondary | ICD-10-CM | POA: Diagnosis not present

## 2016-08-19 DIAGNOSIS — E7800 Pure hypercholesterolemia, unspecified: Secondary | ICD-10-CM | POA: Diagnosis not present

## 2016-08-19 DIAGNOSIS — E119 Type 2 diabetes mellitus without complications: Secondary | ICD-10-CM | POA: Diagnosis not present

## 2016-08-19 DIAGNOSIS — I10 Essential (primary) hypertension: Secondary | ICD-10-CM | POA: Diagnosis not present

## 2016-08-27 DIAGNOSIS — C61 Malignant neoplasm of prostate: Secondary | ICD-10-CM | POA: Diagnosis not present

## 2016-08-29 DIAGNOSIS — I2699 Other pulmonary embolism without acute cor pulmonale: Secondary | ICD-10-CM | POA: Diagnosis not present

## 2016-08-29 DIAGNOSIS — E669 Obesity, unspecified: Secondary | ICD-10-CM | POA: Diagnosis not present

## 2016-08-29 DIAGNOSIS — E782 Mixed hyperlipidemia: Secondary | ICD-10-CM | POA: Diagnosis not present

## 2016-08-29 DIAGNOSIS — E1122 Type 2 diabetes mellitus with diabetic chronic kidney disease: Secondary | ICD-10-CM | POA: Diagnosis not present

## 2016-08-29 DIAGNOSIS — N183 Chronic kidney disease, stage 3 (moderate): Secondary | ICD-10-CM | POA: Diagnosis not present

## 2016-08-29 DIAGNOSIS — I10 Essential (primary) hypertension: Secondary | ICD-10-CM | POA: Diagnosis not present

## 2016-08-29 DIAGNOSIS — Z299 Encounter for prophylactic measures, unspecified: Secondary | ICD-10-CM | POA: Diagnosis not present

## 2016-08-29 DIAGNOSIS — Z6832 Body mass index (BMI) 32.0-32.9, adult: Secondary | ICD-10-CM | POA: Diagnosis not present

## 2016-08-29 DIAGNOSIS — I499 Cardiac arrhythmia, unspecified: Secondary | ICD-10-CM | POA: Diagnosis not present

## 2016-09-17 DIAGNOSIS — L110 Acquired keratosis follicularis: Secondary | ICD-10-CM | POA: Diagnosis not present

## 2016-09-17 DIAGNOSIS — E1140 Type 2 diabetes mellitus with diabetic neuropathy, unspecified: Secondary | ICD-10-CM | POA: Diagnosis not present

## 2016-09-17 DIAGNOSIS — B351 Tinea unguium: Secondary | ICD-10-CM | POA: Diagnosis not present

## 2016-09-18 DIAGNOSIS — I10 Essential (primary) hypertension: Secondary | ICD-10-CM | POA: Diagnosis not present

## 2016-09-18 DIAGNOSIS — E119 Type 2 diabetes mellitus without complications: Secondary | ICD-10-CM | POA: Diagnosis not present

## 2016-09-18 DIAGNOSIS — E7800 Pure hypercholesterolemia, unspecified: Secondary | ICD-10-CM | POA: Diagnosis not present

## 2016-10-21 DIAGNOSIS — E11319 Type 2 diabetes mellitus with unspecified diabetic retinopathy without macular edema: Secondary | ICD-10-CM | POA: Diagnosis not present

## 2016-11-06 DIAGNOSIS — X32XXXD Exposure to sunlight, subsequent encounter: Secondary | ICD-10-CM | POA: Diagnosis not present

## 2016-11-06 DIAGNOSIS — L570 Actinic keratosis: Secondary | ICD-10-CM | POA: Diagnosis not present

## 2016-11-06 DIAGNOSIS — C44629 Squamous cell carcinoma of skin of left upper limb, including shoulder: Secondary | ICD-10-CM | POA: Diagnosis not present

## 2016-11-10 DIAGNOSIS — E119 Type 2 diabetes mellitus without complications: Secondary | ICD-10-CM | POA: Diagnosis not present

## 2016-11-10 DIAGNOSIS — E7800 Pure hypercholesterolemia, unspecified: Secondary | ICD-10-CM | POA: Diagnosis not present

## 2016-11-10 DIAGNOSIS — I10 Essential (primary) hypertension: Secondary | ICD-10-CM | POA: Diagnosis not present

## 2016-11-26 DIAGNOSIS — E1140 Type 2 diabetes mellitus with diabetic neuropathy, unspecified: Secondary | ICD-10-CM | POA: Diagnosis not present

## 2016-11-26 DIAGNOSIS — B351 Tinea unguium: Secondary | ICD-10-CM | POA: Diagnosis not present

## 2016-11-26 DIAGNOSIS — L110 Acquired keratosis follicularis: Secondary | ICD-10-CM | POA: Diagnosis not present

## 2016-12-08 DIAGNOSIS — D044 Carcinoma in situ of skin of scalp and neck: Secondary | ICD-10-CM | POA: Diagnosis not present

## 2016-12-08 DIAGNOSIS — E1165 Type 2 diabetes mellitus with hyperglycemia: Secondary | ICD-10-CM | POA: Diagnosis not present

## 2016-12-08 DIAGNOSIS — X32XXXD Exposure to sunlight, subsequent encounter: Secondary | ICD-10-CM | POA: Diagnosis not present

## 2016-12-08 DIAGNOSIS — C61 Malignant neoplasm of prostate: Secondary | ICD-10-CM | POA: Diagnosis not present

## 2016-12-08 DIAGNOSIS — Z08 Encounter for follow-up examination after completed treatment for malignant neoplasm: Secondary | ICD-10-CM | POA: Diagnosis not present

## 2016-12-08 DIAGNOSIS — Z299 Encounter for prophylactic measures, unspecified: Secondary | ICD-10-CM | POA: Diagnosis not present

## 2016-12-08 DIAGNOSIS — N183 Chronic kidney disease, stage 3 (moderate): Secondary | ICD-10-CM | POA: Diagnosis not present

## 2016-12-08 DIAGNOSIS — Z85828 Personal history of other malignant neoplasm of skin: Secondary | ICD-10-CM | POA: Diagnosis not present

## 2016-12-08 DIAGNOSIS — Z6832 Body mass index (BMI) 32.0-32.9, adult: Secondary | ICD-10-CM | POA: Diagnosis not present

## 2016-12-08 DIAGNOSIS — L570 Actinic keratosis: Secondary | ICD-10-CM | POA: Diagnosis not present

## 2016-12-12 DIAGNOSIS — I10 Essential (primary) hypertension: Secondary | ICD-10-CM | POA: Diagnosis not present

## 2016-12-12 DIAGNOSIS — E119 Type 2 diabetes mellitus without complications: Secondary | ICD-10-CM | POA: Diagnosis not present

## 2016-12-12 DIAGNOSIS — E7800 Pure hypercholesterolemia, unspecified: Secondary | ICD-10-CM | POA: Diagnosis not present

## 2016-12-19 DIAGNOSIS — Z23 Encounter for immunization: Secondary | ICD-10-CM | POA: Diagnosis not present

## 2017-01-12 DIAGNOSIS — I10 Essential (primary) hypertension: Secondary | ICD-10-CM | POA: Diagnosis not present

## 2017-01-12 DIAGNOSIS — E119 Type 2 diabetes mellitus without complications: Secondary | ICD-10-CM | POA: Diagnosis not present

## 2017-01-12 DIAGNOSIS — E7800 Pure hypercholesterolemia, unspecified: Secondary | ICD-10-CM | POA: Diagnosis not present

## 2017-02-03 DIAGNOSIS — E7800 Pure hypercholesterolemia, unspecified: Secondary | ICD-10-CM | POA: Diagnosis not present

## 2017-02-03 DIAGNOSIS — E119 Type 2 diabetes mellitus without complications: Secondary | ICD-10-CM | POA: Diagnosis not present

## 2017-02-03 DIAGNOSIS — I10 Essential (primary) hypertension: Secondary | ICD-10-CM | POA: Diagnosis not present

## 2017-02-04 DIAGNOSIS — E1140 Type 2 diabetes mellitus with diabetic neuropathy, unspecified: Secondary | ICD-10-CM | POA: Diagnosis not present

## 2017-02-04 DIAGNOSIS — L110 Acquired keratosis follicularis: Secondary | ICD-10-CM | POA: Diagnosis not present

## 2017-02-04 DIAGNOSIS — B351 Tinea unguium: Secondary | ICD-10-CM | POA: Diagnosis not present

## 2017-02-18 DIAGNOSIS — R399 Unspecified symptoms and signs involving the genitourinary system: Secondary | ICD-10-CM | POA: Diagnosis not present

## 2017-02-18 DIAGNOSIS — C61 Malignant neoplasm of prostate: Secondary | ICD-10-CM | POA: Diagnosis not present

## 2017-02-18 DIAGNOSIS — R350 Frequency of micturition: Secondary | ICD-10-CM | POA: Diagnosis not present

## 2017-02-18 DIAGNOSIS — Z79899 Other long term (current) drug therapy: Secondary | ICD-10-CM | POA: Diagnosis not present

## 2017-03-12 DIAGNOSIS — I10 Essential (primary) hypertension: Secondary | ICD-10-CM | POA: Diagnosis not present

## 2017-03-12 DIAGNOSIS — E7800 Pure hypercholesterolemia, unspecified: Secondary | ICD-10-CM | POA: Diagnosis not present

## 2017-03-12 DIAGNOSIS — E119 Type 2 diabetes mellitus without complications: Secondary | ICD-10-CM | POA: Diagnosis not present

## 2017-03-16 DIAGNOSIS — E1122 Type 2 diabetes mellitus with diabetic chronic kidney disease: Secondary | ICD-10-CM | POA: Diagnosis not present

## 2017-04-08 DIAGNOSIS — I10 Essential (primary) hypertension: Secondary | ICD-10-CM | POA: Diagnosis not present

## 2017-04-08 DIAGNOSIS — E119 Type 2 diabetes mellitus without complications: Secondary | ICD-10-CM | POA: Diagnosis not present

## 2017-04-08 DIAGNOSIS — E7800 Pure hypercholesterolemia, unspecified: Secondary | ICD-10-CM | POA: Diagnosis not present

## 2017-04-23 DIAGNOSIS — L110 Acquired keratosis follicularis: Secondary | ICD-10-CM | POA: Diagnosis not present

## 2017-04-23 DIAGNOSIS — B351 Tinea unguium: Secondary | ICD-10-CM | POA: Diagnosis not present

## 2017-04-23 DIAGNOSIS — E1140 Type 2 diabetes mellitus with diabetic neuropathy, unspecified: Secondary | ICD-10-CM | POA: Diagnosis not present

## 2017-05-01 DIAGNOSIS — E119 Type 2 diabetes mellitus without complications: Secondary | ICD-10-CM | POA: Diagnosis not present

## 2017-05-01 DIAGNOSIS — I10 Essential (primary) hypertension: Secondary | ICD-10-CM | POA: Diagnosis not present

## 2017-05-01 DIAGNOSIS — E7800 Pure hypercholesterolemia, unspecified: Secondary | ICD-10-CM | POA: Diagnosis not present

## 2017-06-03 DIAGNOSIS — R5383 Other fatigue: Secondary | ICD-10-CM | POA: Diagnosis not present

## 2017-06-03 DIAGNOSIS — I10 Essential (primary) hypertension: Secondary | ICD-10-CM | POA: Diagnosis not present

## 2017-06-03 DIAGNOSIS — E1122 Type 2 diabetes mellitus with diabetic chronic kidney disease: Secondary | ICD-10-CM | POA: Diagnosis not present

## 2017-06-03 DIAGNOSIS — Z299 Encounter for prophylactic measures, unspecified: Secondary | ICD-10-CM | POA: Diagnosis not present

## 2017-06-03 DIAGNOSIS — Z1331 Encounter for screening for depression: Secondary | ICD-10-CM | POA: Diagnosis not present

## 2017-06-03 DIAGNOSIS — Z1339 Encounter for screening examination for other mental health and behavioral disorders: Secondary | ICD-10-CM | POA: Diagnosis not present

## 2017-06-03 DIAGNOSIS — E1165 Type 2 diabetes mellitus with hyperglycemia: Secondary | ICD-10-CM | POA: Diagnosis not present

## 2017-06-03 DIAGNOSIS — Z0000 Encounter for general adult medical examination without abnormal findings: Secondary | ICD-10-CM | POA: Diagnosis not present

## 2017-06-03 DIAGNOSIS — C61 Malignant neoplasm of prostate: Secondary | ICD-10-CM | POA: Diagnosis not present

## 2017-06-03 DIAGNOSIS — Z7189 Other specified counseling: Secondary | ICD-10-CM | POA: Diagnosis not present

## 2017-06-03 DIAGNOSIS — Z1211 Encounter for screening for malignant neoplasm of colon: Secondary | ICD-10-CM | POA: Diagnosis not present

## 2017-06-03 DIAGNOSIS — Z79899 Other long term (current) drug therapy: Secondary | ICD-10-CM | POA: Diagnosis not present

## 2017-06-03 DIAGNOSIS — N183 Chronic kidney disease, stage 3 (moderate): Secondary | ICD-10-CM | POA: Diagnosis not present

## 2017-06-03 DIAGNOSIS — Z6831 Body mass index (BMI) 31.0-31.9, adult: Secondary | ICD-10-CM | POA: Diagnosis not present

## 2017-06-09 DIAGNOSIS — Z299 Encounter for prophylactic measures, unspecified: Secondary | ICD-10-CM | POA: Diagnosis not present

## 2017-06-09 DIAGNOSIS — Z6831 Body mass index (BMI) 31.0-31.9, adult: Secondary | ICD-10-CM | POA: Diagnosis not present

## 2017-06-09 DIAGNOSIS — L820 Inflamed seborrheic keratosis: Secondary | ICD-10-CM | POA: Diagnosis not present

## 2017-06-24 DIAGNOSIS — N183 Chronic kidney disease, stage 3 (moderate): Secondary | ICD-10-CM | POA: Diagnosis not present

## 2017-06-24 DIAGNOSIS — E119 Type 2 diabetes mellitus without complications: Secondary | ICD-10-CM | POA: Diagnosis not present

## 2017-06-24 DIAGNOSIS — I2699 Other pulmonary embolism without acute cor pulmonale: Secondary | ICD-10-CM | POA: Diagnosis not present

## 2017-06-24 DIAGNOSIS — E1165 Type 2 diabetes mellitus with hyperglycemia: Secondary | ICD-10-CM | POA: Diagnosis not present

## 2017-06-24 DIAGNOSIS — E7800 Pure hypercholesterolemia, unspecified: Secondary | ICD-10-CM | POA: Diagnosis not present

## 2017-06-24 DIAGNOSIS — I10 Essential (primary) hypertension: Secondary | ICD-10-CM | POA: Diagnosis not present

## 2017-06-24 DIAGNOSIS — Z6832 Body mass index (BMI) 32.0-32.9, adult: Secondary | ICD-10-CM | POA: Diagnosis not present

## 2017-06-24 DIAGNOSIS — E1122 Type 2 diabetes mellitus with diabetic chronic kidney disease: Secondary | ICD-10-CM | POA: Diagnosis not present

## 2017-06-24 DIAGNOSIS — Z299 Encounter for prophylactic measures, unspecified: Secondary | ICD-10-CM | POA: Diagnosis not present

## 2017-07-16 DIAGNOSIS — E1140 Type 2 diabetes mellitus with diabetic neuropathy, unspecified: Secondary | ICD-10-CM | POA: Diagnosis not present

## 2017-07-16 DIAGNOSIS — L110 Acquired keratosis follicularis: Secondary | ICD-10-CM | POA: Diagnosis not present

## 2017-07-16 DIAGNOSIS — B351 Tinea unguium: Secondary | ICD-10-CM | POA: Diagnosis not present

## 2017-08-07 DIAGNOSIS — E119 Type 2 diabetes mellitus without complications: Secondary | ICD-10-CM | POA: Diagnosis not present

## 2017-08-07 DIAGNOSIS — I10 Essential (primary) hypertension: Secondary | ICD-10-CM | POA: Diagnosis not present

## 2017-08-07 DIAGNOSIS — E7800 Pure hypercholesterolemia, unspecified: Secondary | ICD-10-CM | POA: Diagnosis not present

## 2017-08-19 DIAGNOSIS — C61 Malignant neoplasm of prostate: Secondary | ICD-10-CM | POA: Diagnosis not present

## 2017-08-19 DIAGNOSIS — R350 Frequency of micturition: Secondary | ICD-10-CM | POA: Diagnosis not present

## 2017-08-19 DIAGNOSIS — R232 Flushing: Secondary | ICD-10-CM | POA: Diagnosis not present

## 2017-09-14 DIAGNOSIS — E7800 Pure hypercholesterolemia, unspecified: Secondary | ICD-10-CM | POA: Diagnosis not present

## 2017-09-14 DIAGNOSIS — E119 Type 2 diabetes mellitus without complications: Secondary | ICD-10-CM | POA: Diagnosis not present

## 2017-09-14 DIAGNOSIS — I10 Essential (primary) hypertension: Secondary | ICD-10-CM | POA: Diagnosis not present

## 2017-09-24 DIAGNOSIS — L110 Acquired keratosis follicularis: Secondary | ICD-10-CM | POA: Diagnosis not present

## 2017-09-24 DIAGNOSIS — E1140 Type 2 diabetes mellitus with diabetic neuropathy, unspecified: Secondary | ICD-10-CM | POA: Diagnosis not present

## 2017-09-24 DIAGNOSIS — B351 Tinea unguium: Secondary | ICD-10-CM | POA: Diagnosis not present

## 2017-09-28 DIAGNOSIS — E1165 Type 2 diabetes mellitus with hyperglycemia: Secondary | ICD-10-CM | POA: Diagnosis not present

## 2017-09-28 DIAGNOSIS — I10 Essential (primary) hypertension: Secondary | ICD-10-CM | POA: Diagnosis not present

## 2017-09-28 DIAGNOSIS — I2699 Other pulmonary embolism without acute cor pulmonale: Secondary | ICD-10-CM | POA: Diagnosis not present

## 2017-09-28 DIAGNOSIS — E1122 Type 2 diabetes mellitus with diabetic chronic kidney disease: Secondary | ICD-10-CM | POA: Diagnosis not present

## 2017-09-28 DIAGNOSIS — Z6832 Body mass index (BMI) 32.0-32.9, adult: Secondary | ICD-10-CM | POA: Diagnosis not present

## 2017-09-28 DIAGNOSIS — N183 Chronic kidney disease, stage 3 (moderate): Secondary | ICD-10-CM | POA: Diagnosis not present

## 2017-09-28 DIAGNOSIS — Z299 Encounter for prophylactic measures, unspecified: Secondary | ICD-10-CM | POA: Diagnosis not present

## 2017-10-13 DIAGNOSIS — E7800 Pure hypercholesterolemia, unspecified: Secondary | ICD-10-CM | POA: Diagnosis not present

## 2017-10-13 DIAGNOSIS — E119 Type 2 diabetes mellitus without complications: Secondary | ICD-10-CM | POA: Diagnosis not present

## 2017-10-13 DIAGNOSIS — I10 Essential (primary) hypertension: Secondary | ICD-10-CM | POA: Diagnosis not present

## 2017-11-06 DIAGNOSIS — E7800 Pure hypercholesterolemia, unspecified: Secondary | ICD-10-CM | POA: Diagnosis not present

## 2017-11-06 DIAGNOSIS — I10 Essential (primary) hypertension: Secondary | ICD-10-CM | POA: Diagnosis not present

## 2017-11-06 DIAGNOSIS — E119 Type 2 diabetes mellitus without complications: Secondary | ICD-10-CM | POA: Diagnosis not present

## 2017-12-02 DIAGNOSIS — E119 Type 2 diabetes mellitus without complications: Secondary | ICD-10-CM | POA: Diagnosis not present

## 2017-12-02 DIAGNOSIS — E7800 Pure hypercholesterolemia, unspecified: Secondary | ICD-10-CM | POA: Diagnosis not present

## 2017-12-02 DIAGNOSIS — I10 Essential (primary) hypertension: Secondary | ICD-10-CM | POA: Diagnosis not present

## 2017-12-03 DIAGNOSIS — B351 Tinea unguium: Secondary | ICD-10-CM | POA: Diagnosis not present

## 2017-12-03 DIAGNOSIS — E1140 Type 2 diabetes mellitus with diabetic neuropathy, unspecified: Secondary | ICD-10-CM | POA: Diagnosis not present

## 2017-12-03 DIAGNOSIS — L110 Acquired keratosis follicularis: Secondary | ICD-10-CM | POA: Diagnosis not present

## 2017-12-07 DIAGNOSIS — I10 Essential (primary) hypertension: Secondary | ICD-10-CM | POA: Diagnosis not present

## 2017-12-07 DIAGNOSIS — Z6832 Body mass index (BMI) 32.0-32.9, adult: Secondary | ICD-10-CM | POA: Diagnosis not present

## 2017-12-07 DIAGNOSIS — R21 Rash and other nonspecific skin eruption: Secondary | ICD-10-CM | POA: Diagnosis not present

## 2017-12-07 DIAGNOSIS — Z299 Encounter for prophylactic measures, unspecified: Secondary | ICD-10-CM | POA: Diagnosis not present

## 2017-12-07 DIAGNOSIS — E1122 Type 2 diabetes mellitus with diabetic chronic kidney disease: Secondary | ICD-10-CM | POA: Diagnosis not present

## 2017-12-07 DIAGNOSIS — N183 Chronic kidney disease, stage 3 (moderate): Secondary | ICD-10-CM | POA: Diagnosis not present

## 2017-12-11 DIAGNOSIS — E1122 Type 2 diabetes mellitus with diabetic chronic kidney disease: Secondary | ICD-10-CM | POA: Diagnosis not present

## 2017-12-11 DIAGNOSIS — N183 Chronic kidney disease, stage 3 (moderate): Secondary | ICD-10-CM | POA: Diagnosis not present

## 2017-12-11 DIAGNOSIS — Z6832 Body mass index (BMI) 32.0-32.9, adult: Secondary | ICD-10-CM | POA: Diagnosis not present

## 2017-12-11 DIAGNOSIS — Z299 Encounter for prophylactic measures, unspecified: Secondary | ICD-10-CM | POA: Diagnosis not present

## 2017-12-11 DIAGNOSIS — I10 Essential (primary) hypertension: Secondary | ICD-10-CM | POA: Diagnosis not present

## 2017-12-11 DIAGNOSIS — L409 Psoriasis, unspecified: Secondary | ICD-10-CM | POA: Diagnosis not present

## 2017-12-11 DIAGNOSIS — I2699 Other pulmonary embolism without acute cor pulmonale: Secondary | ICD-10-CM | POA: Diagnosis not present

## 2017-12-25 DIAGNOSIS — Z23 Encounter for immunization: Secondary | ICD-10-CM | POA: Diagnosis not present

## 2018-01-05 DIAGNOSIS — Z6832 Body mass index (BMI) 32.0-32.9, adult: Secondary | ICD-10-CM | POA: Diagnosis not present

## 2018-01-05 DIAGNOSIS — E1165 Type 2 diabetes mellitus with hyperglycemia: Secondary | ICD-10-CM | POA: Diagnosis not present

## 2018-01-05 DIAGNOSIS — N183 Chronic kidney disease, stage 3 (moderate): Secondary | ICD-10-CM | POA: Diagnosis not present

## 2018-01-05 DIAGNOSIS — Z299 Encounter for prophylactic measures, unspecified: Secondary | ICD-10-CM | POA: Diagnosis not present

## 2018-01-05 DIAGNOSIS — I10 Essential (primary) hypertension: Secondary | ICD-10-CM | POA: Diagnosis not present

## 2018-01-05 DIAGNOSIS — E1122 Type 2 diabetes mellitus with diabetic chronic kidney disease: Secondary | ICD-10-CM | POA: Diagnosis not present

## 2018-01-22 DIAGNOSIS — Z299 Encounter for prophylactic measures, unspecified: Secondary | ICD-10-CM | POA: Diagnosis not present

## 2018-01-22 DIAGNOSIS — I10 Essential (primary) hypertension: Secondary | ICD-10-CM | POA: Diagnosis not present

## 2018-01-22 DIAGNOSIS — Z789 Other specified health status: Secondary | ICD-10-CM | POA: Diagnosis not present

## 2018-01-22 DIAGNOSIS — N183 Chronic kidney disease, stage 3 (moderate): Secondary | ICD-10-CM | POA: Diagnosis not present

## 2018-01-22 DIAGNOSIS — L309 Dermatitis, unspecified: Secondary | ICD-10-CM | POA: Diagnosis not present

## 2018-01-22 DIAGNOSIS — Z6831 Body mass index (BMI) 31.0-31.9, adult: Secondary | ICD-10-CM | POA: Diagnosis not present

## 2018-01-22 DIAGNOSIS — L0390 Cellulitis, unspecified: Secondary | ICD-10-CM | POA: Diagnosis not present

## 2018-01-26 DIAGNOSIS — E11319 Type 2 diabetes mellitus with unspecified diabetic retinopathy without macular edema: Secondary | ICD-10-CM | POA: Diagnosis not present

## 2018-01-29 DIAGNOSIS — Z6832 Body mass index (BMI) 32.0-32.9, adult: Secondary | ICD-10-CM | POA: Diagnosis not present

## 2018-01-29 DIAGNOSIS — Z299 Encounter for prophylactic measures, unspecified: Secondary | ICD-10-CM | POA: Diagnosis not present

## 2018-01-29 DIAGNOSIS — I10 Essential (primary) hypertension: Secondary | ICD-10-CM | POA: Diagnosis not present

## 2018-01-29 DIAGNOSIS — L309 Dermatitis, unspecified: Secondary | ICD-10-CM | POA: Diagnosis not present

## 2018-02-04 DIAGNOSIS — E7800 Pure hypercholesterolemia, unspecified: Secondary | ICD-10-CM | POA: Diagnosis not present

## 2018-02-04 DIAGNOSIS — I10 Essential (primary) hypertension: Secondary | ICD-10-CM | POA: Diagnosis not present

## 2018-02-04 DIAGNOSIS — E119 Type 2 diabetes mellitus without complications: Secondary | ICD-10-CM | POA: Diagnosis not present

## 2018-02-09 DIAGNOSIS — E1122 Type 2 diabetes mellitus with diabetic chronic kidney disease: Secondary | ICD-10-CM | POA: Diagnosis not present

## 2018-02-09 DIAGNOSIS — E1165 Type 2 diabetes mellitus with hyperglycemia: Secondary | ICD-10-CM | POA: Diagnosis not present

## 2018-02-09 DIAGNOSIS — M25562 Pain in left knee: Secondary | ICD-10-CM | POA: Diagnosis not present

## 2018-02-09 DIAGNOSIS — Z299 Encounter for prophylactic measures, unspecified: Secondary | ICD-10-CM | POA: Diagnosis not present

## 2018-02-09 DIAGNOSIS — N183 Chronic kidney disease, stage 3 (moderate): Secondary | ICD-10-CM | POA: Diagnosis not present

## 2018-02-09 DIAGNOSIS — I10 Essential (primary) hypertension: Secondary | ICD-10-CM | POA: Diagnosis not present

## 2018-02-09 DIAGNOSIS — Z6832 Body mass index (BMI) 32.0-32.9, adult: Secondary | ICD-10-CM | POA: Diagnosis not present

## 2018-02-10 DIAGNOSIS — B351 Tinea unguium: Secondary | ICD-10-CM | POA: Diagnosis not present

## 2018-02-10 DIAGNOSIS — E1140 Type 2 diabetes mellitus with diabetic neuropathy, unspecified: Secondary | ICD-10-CM | POA: Diagnosis not present

## 2018-02-10 DIAGNOSIS — L110 Acquired keratosis follicularis: Secondary | ICD-10-CM | POA: Diagnosis not present

## 2018-02-17 DIAGNOSIS — R350 Frequency of micturition: Secondary | ICD-10-CM | POA: Diagnosis not present

## 2018-02-17 DIAGNOSIS — Z79818 Long term (current) use of other agents affecting estrogen receptors and estrogen levels: Secondary | ICD-10-CM | POA: Diagnosis not present

## 2018-02-17 DIAGNOSIS — C61 Malignant neoplasm of prostate: Secondary | ICD-10-CM | POA: Diagnosis not present

## 2018-03-04 DIAGNOSIS — L309 Dermatitis, unspecified: Secondary | ICD-10-CM | POA: Diagnosis not present

## 2018-03-04 DIAGNOSIS — E1165 Type 2 diabetes mellitus with hyperglycemia: Secondary | ICD-10-CM | POA: Diagnosis not present

## 2018-03-04 DIAGNOSIS — M25562 Pain in left knee: Secondary | ICD-10-CM | POA: Diagnosis not present

## 2018-03-04 DIAGNOSIS — Z299 Encounter for prophylactic measures, unspecified: Secondary | ICD-10-CM | POA: Diagnosis not present

## 2018-03-04 DIAGNOSIS — I10 Essential (primary) hypertension: Secondary | ICD-10-CM | POA: Diagnosis not present

## 2018-03-04 DIAGNOSIS — Z6832 Body mass index (BMI) 32.0-32.9, adult: Secondary | ICD-10-CM | POA: Diagnosis not present

## 2018-03-09 DIAGNOSIS — I10 Essential (primary) hypertension: Secondary | ICD-10-CM | POA: Diagnosis not present

## 2018-03-09 DIAGNOSIS — M170 Bilateral primary osteoarthritis of knee: Secondary | ICD-10-CM | POA: Diagnosis not present

## 2018-03-09 DIAGNOSIS — M2560 Stiffness of unspecified joint, not elsewhere classified: Secondary | ICD-10-CM | POA: Diagnosis not present

## 2018-03-09 DIAGNOSIS — R269 Unspecified abnormalities of gait and mobility: Secondary | ICD-10-CM | POA: Diagnosis not present

## 2018-03-09 DIAGNOSIS — Z299 Encounter for prophylactic measures, unspecified: Secondary | ICD-10-CM | POA: Diagnosis not present

## 2018-03-09 DIAGNOSIS — Z6832 Body mass index (BMI) 32.0-32.9, adult: Secondary | ICD-10-CM | POA: Diagnosis not present

## 2018-03-09 DIAGNOSIS — R262 Difficulty in walking, not elsewhere classified: Secondary | ICD-10-CM | POA: Diagnosis not present

## 2018-03-11 DIAGNOSIS — Z6832 Body mass index (BMI) 32.0-32.9, adult: Secondary | ICD-10-CM | POA: Diagnosis not present

## 2018-03-11 DIAGNOSIS — I10 Essential (primary) hypertension: Secondary | ICD-10-CM | POA: Diagnosis not present

## 2018-03-11 DIAGNOSIS — Z299 Encounter for prophylactic measures, unspecified: Secondary | ICD-10-CM | POA: Diagnosis not present

## 2018-03-11 DIAGNOSIS — M25561 Pain in right knee: Secondary | ICD-10-CM | POA: Diagnosis not present

## 2018-03-11 DIAGNOSIS — M1711 Unilateral primary osteoarthritis, right knee: Secondary | ICD-10-CM | POA: Diagnosis not present

## 2018-03-16 DIAGNOSIS — Z6832 Body mass index (BMI) 32.0-32.9, adult: Secondary | ICD-10-CM | POA: Diagnosis not present

## 2018-03-16 DIAGNOSIS — M1711 Unilateral primary osteoarthritis, right knee: Secondary | ICD-10-CM | POA: Diagnosis not present

## 2018-03-16 DIAGNOSIS — R262 Difficulty in walking, not elsewhere classified: Secondary | ICD-10-CM | POA: Diagnosis not present

## 2018-03-16 DIAGNOSIS — I10 Essential (primary) hypertension: Secondary | ICD-10-CM | POA: Diagnosis not present

## 2018-03-16 DIAGNOSIS — R269 Unspecified abnormalities of gait and mobility: Secondary | ICD-10-CM | POA: Diagnosis not present

## 2018-03-16 DIAGNOSIS — Z299 Encounter for prophylactic measures, unspecified: Secondary | ICD-10-CM | POA: Diagnosis not present

## 2018-03-16 DIAGNOSIS — M2560 Stiffness of unspecified joint, not elsewhere classified: Secondary | ICD-10-CM | POA: Diagnosis not present

## 2018-03-18 DIAGNOSIS — R269 Unspecified abnormalities of gait and mobility: Secondary | ICD-10-CM | POA: Diagnosis not present

## 2018-03-18 DIAGNOSIS — R262 Difficulty in walking, not elsewhere classified: Secondary | ICD-10-CM | POA: Diagnosis not present

## 2018-03-18 DIAGNOSIS — Z299 Encounter for prophylactic measures, unspecified: Secondary | ICD-10-CM | POA: Diagnosis not present

## 2018-03-18 DIAGNOSIS — M2560 Stiffness of unspecified joint, not elsewhere classified: Secondary | ICD-10-CM | POA: Diagnosis not present

## 2018-03-18 DIAGNOSIS — M1712 Unilateral primary osteoarthritis, left knee: Secondary | ICD-10-CM | POA: Diagnosis not present

## 2018-03-18 DIAGNOSIS — Z6832 Body mass index (BMI) 32.0-32.9, adult: Secondary | ICD-10-CM | POA: Diagnosis not present

## 2018-03-18 DIAGNOSIS — I10 Essential (primary) hypertension: Secondary | ICD-10-CM | POA: Diagnosis not present

## 2018-03-19 DIAGNOSIS — E1122 Type 2 diabetes mellitus with diabetic chronic kidney disease: Secondary | ICD-10-CM | POA: Diagnosis not present

## 2018-03-19 DIAGNOSIS — Z299 Encounter for prophylactic measures, unspecified: Secondary | ICD-10-CM | POA: Diagnosis not present

## 2018-03-19 DIAGNOSIS — Z6833 Body mass index (BMI) 33.0-33.9, adult: Secondary | ICD-10-CM | POA: Diagnosis not present

## 2018-03-19 DIAGNOSIS — I10 Essential (primary) hypertension: Secondary | ICD-10-CM | POA: Diagnosis not present

## 2018-03-19 DIAGNOSIS — M170 Bilateral primary osteoarthritis of knee: Secondary | ICD-10-CM | POA: Diagnosis not present

## 2018-03-19 DIAGNOSIS — E1165 Type 2 diabetes mellitus with hyperglycemia: Secondary | ICD-10-CM | POA: Diagnosis not present

## 2018-03-19 DIAGNOSIS — M1712 Unilateral primary osteoarthritis, left knee: Secondary | ICD-10-CM | POA: Diagnosis not present

## 2018-03-23 DIAGNOSIS — M1711 Unilateral primary osteoarthritis, right knee: Secondary | ICD-10-CM | POA: Diagnosis not present

## 2018-03-23 DIAGNOSIS — Z6833 Body mass index (BMI) 33.0-33.9, adult: Secondary | ICD-10-CM | POA: Diagnosis not present

## 2018-03-23 DIAGNOSIS — Z299 Encounter for prophylactic measures, unspecified: Secondary | ICD-10-CM | POA: Diagnosis not present

## 2018-03-23 DIAGNOSIS — I10 Essential (primary) hypertension: Secondary | ICD-10-CM | POA: Diagnosis not present

## 2018-03-24 IMAGING — CT NM PET TUM IMG INITIAL (PI) SKULL BASE T - THIGH
1 of 7 series · 1 of 25 positions shown · non-contrast
Comparison: Chest CTA on 12/24/2015 and AP CT on 07/25/2010

CLINICAL DATA: Initial treatment strategy for left lower lobe
pulmonary nodule.

EXAM:
NUCLEAR MEDICINE PET SKULL BASE TO THIGH
TECHNIQUE: 10.4 mCi F-18 FDG was injected intravenously. Full-ring PET imaging
was performed from the skull base to thigh after the radiotracer. CT
data was obtained and used for attenuation correction and anatomic
localization.
FASTING BLOOD GLUCOSE:  Value: 125 mg/dl

[Series 4: ct sk_thigh 5.0 b31f · axial · 5.0mm · 0.98mm/px · 1 of 238 slices shown]
[im 238/238  brain]
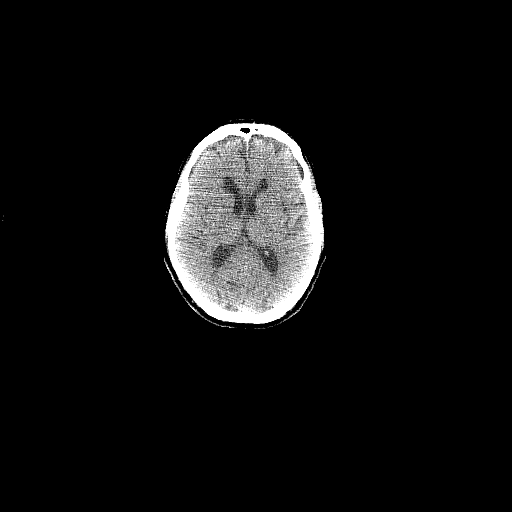

[1 of 25 positions shown; findings below may reference images not displayed]

FINDINGS: NECK

No hypermetabolic lymph nodes in the neck.

CHEST

No hypermetabolic mediastinal or hilar nodes. Previously seen
ill-defined 1.8 cm nodule in the medial left lower lobe has resolved
since previous study, consistent with resolving infectious or
inflammatory process or pulmonary infarct given previous pulmonary
embolism. No other suspicious pulmonary nodules are identified.

Pleural-parenchymal scarring in the posterior right lower lobe is
stable compared to previous abdomen CT dating back to 6246. Mild
biapical pleural- parenchymal scarring also noted.

ABDOMEN/PELVIS

No abnormal hypermetabolic activity within the liver, pancreas, or
spleen. Mild nodularity in both adrenal glands remain stable since
6246 which is associated with hypermetabolic activity, consistent
with benign nodular hyperplasia or small adrenal adenomas.

No hypermetabolic lymph nodes in the abdomen or pelvis.

Aortic atherosclerosis. Punctate 1-2 mm bilateral renal calculi are
noted, without evidence of ureteral calculi or hydronephrosis.
Mildly enlarged prostate gland remains stable.

SKELETON

No focal hypermetabolic activity to suggest skeletal metastasis.
IMPRESSION: Resolution of left lower lobe pulmonary nodule since previous study,
consistent with resolving infectious or inflammatory process, or
pulmonary infarct given recent pulmonary embolism.

No evidence of malignancy within the neck, chest, abdomen, or
pelvis.

## 2018-03-25 DIAGNOSIS — M25562 Pain in left knee: Secondary | ICD-10-CM | POA: Diagnosis not present

## 2018-03-25 DIAGNOSIS — R269 Unspecified abnormalities of gait and mobility: Secondary | ICD-10-CM | POA: Diagnosis not present

## 2018-03-25 DIAGNOSIS — I10 Essential (primary) hypertension: Secondary | ICD-10-CM | POA: Diagnosis not present

## 2018-03-25 DIAGNOSIS — M1712 Unilateral primary osteoarthritis, left knee: Secondary | ICD-10-CM | POA: Diagnosis not present

## 2018-03-25 DIAGNOSIS — Z299 Encounter for prophylactic measures, unspecified: Secondary | ICD-10-CM | POA: Diagnosis not present

## 2018-03-25 DIAGNOSIS — Z6833 Body mass index (BMI) 33.0-33.9, adult: Secondary | ICD-10-CM | POA: Diagnosis not present

## 2018-03-30 DIAGNOSIS — R269 Unspecified abnormalities of gait and mobility: Secondary | ICD-10-CM | POA: Diagnosis not present

## 2018-03-30 DIAGNOSIS — Z299 Encounter for prophylactic measures, unspecified: Secondary | ICD-10-CM | POA: Diagnosis not present

## 2018-03-30 DIAGNOSIS — R262 Difficulty in walking, not elsewhere classified: Secondary | ICD-10-CM | POA: Diagnosis not present

## 2018-03-30 DIAGNOSIS — Z6833 Body mass index (BMI) 33.0-33.9, adult: Secondary | ICD-10-CM | POA: Diagnosis not present

## 2018-03-30 DIAGNOSIS — I10 Essential (primary) hypertension: Secondary | ICD-10-CM | POA: Diagnosis not present

## 2018-03-30 DIAGNOSIS — M1711 Unilateral primary osteoarthritis, right knee: Secondary | ICD-10-CM | POA: Diagnosis not present

## 2018-03-30 DIAGNOSIS — M2560 Stiffness of unspecified joint, not elsewhere classified: Secondary | ICD-10-CM | POA: Diagnosis not present

## 2018-03-31 DIAGNOSIS — E7800 Pure hypercholesterolemia, unspecified: Secondary | ICD-10-CM | POA: Diagnosis not present

## 2018-03-31 DIAGNOSIS — E119 Type 2 diabetes mellitus without complications: Secondary | ICD-10-CM | POA: Diagnosis not present

## 2018-03-31 DIAGNOSIS — I10 Essential (primary) hypertension: Secondary | ICD-10-CM | POA: Diagnosis not present

## 2018-04-02 DIAGNOSIS — Z6833 Body mass index (BMI) 33.0-33.9, adult: Secondary | ICD-10-CM | POA: Diagnosis not present

## 2018-04-02 DIAGNOSIS — M25562 Pain in left knee: Secondary | ICD-10-CM | POA: Diagnosis not present

## 2018-04-02 DIAGNOSIS — Z299 Encounter for prophylactic measures, unspecified: Secondary | ICD-10-CM | POA: Diagnosis not present

## 2018-04-02 DIAGNOSIS — Z789 Other specified health status: Secondary | ICD-10-CM | POA: Diagnosis not present

## 2018-04-02 DIAGNOSIS — M1712 Unilateral primary osteoarthritis, left knee: Secondary | ICD-10-CM | POA: Diagnosis not present

## 2018-04-06 DIAGNOSIS — Z299 Encounter for prophylactic measures, unspecified: Secondary | ICD-10-CM | POA: Diagnosis not present

## 2018-04-06 DIAGNOSIS — Z6833 Body mass index (BMI) 33.0-33.9, adult: Secondary | ICD-10-CM | POA: Diagnosis not present

## 2018-04-06 DIAGNOSIS — M25562 Pain in left knee: Secondary | ICD-10-CM | POA: Diagnosis not present

## 2018-04-06 DIAGNOSIS — M1711 Unilateral primary osteoarthritis, right knee: Secondary | ICD-10-CM | POA: Diagnosis not present

## 2018-04-06 DIAGNOSIS — N179 Acute kidney failure, unspecified: Secondary | ICD-10-CM | POA: Diagnosis not present

## 2018-04-06 DIAGNOSIS — M2560 Stiffness of unspecified joint, not elsewhere classified: Secondary | ICD-10-CM | POA: Diagnosis not present

## 2018-04-06 DIAGNOSIS — I10 Essential (primary) hypertension: Secondary | ICD-10-CM | POA: Diagnosis not present

## 2018-04-08 DIAGNOSIS — M25562 Pain in left knee: Secondary | ICD-10-CM | POA: Diagnosis not present

## 2018-04-08 DIAGNOSIS — M1712 Unilateral primary osteoarthritis, left knee: Secondary | ICD-10-CM | POA: Diagnosis not present

## 2018-04-08 DIAGNOSIS — I10 Essential (primary) hypertension: Secondary | ICD-10-CM | POA: Diagnosis not present

## 2018-04-08 DIAGNOSIS — Z299 Encounter for prophylactic measures, unspecified: Secondary | ICD-10-CM | POA: Diagnosis not present

## 2018-04-08 DIAGNOSIS — Z6833 Body mass index (BMI) 33.0-33.9, adult: Secondary | ICD-10-CM | POA: Diagnosis not present

## 2018-04-09 DIAGNOSIS — S83232A Complex tear of medial meniscus, current injury, left knee, initial encounter: Secondary | ICD-10-CM | POA: Diagnosis not present

## 2018-04-09 DIAGNOSIS — M25562 Pain in left knee: Secondary | ICD-10-CM | POA: Diagnosis not present

## 2018-04-09 DIAGNOSIS — M1712 Unilateral primary osteoarthritis, left knee: Secondary | ICD-10-CM | POA: Diagnosis not present

## 2018-04-12 DIAGNOSIS — R413 Other amnesia: Secondary | ICD-10-CM | POA: Diagnosis not present

## 2018-04-12 DIAGNOSIS — Z6832 Body mass index (BMI) 32.0-32.9, adult: Secondary | ICD-10-CM | POA: Diagnosis not present

## 2018-04-12 DIAGNOSIS — E1122 Type 2 diabetes mellitus with diabetic chronic kidney disease: Secondary | ICD-10-CM | POA: Diagnosis not present

## 2018-04-12 DIAGNOSIS — I10 Essential (primary) hypertension: Secondary | ICD-10-CM | POA: Diagnosis not present

## 2018-04-12 DIAGNOSIS — E1165 Type 2 diabetes mellitus with hyperglycemia: Secondary | ICD-10-CM | POA: Diagnosis not present

## 2018-04-12 DIAGNOSIS — N183 Chronic kidney disease, stage 3 (moderate): Secondary | ICD-10-CM | POA: Diagnosis not present

## 2018-04-12 DIAGNOSIS — Z299 Encounter for prophylactic measures, unspecified: Secondary | ICD-10-CM | POA: Diagnosis not present

## 2018-04-13 DIAGNOSIS — Z299 Encounter for prophylactic measures, unspecified: Secondary | ICD-10-CM | POA: Diagnosis not present

## 2018-04-13 DIAGNOSIS — R269 Unspecified abnormalities of gait and mobility: Secondary | ICD-10-CM | POA: Diagnosis not present

## 2018-04-13 DIAGNOSIS — I10 Essential (primary) hypertension: Secondary | ICD-10-CM | POA: Diagnosis not present

## 2018-04-13 DIAGNOSIS — M1711 Unilateral primary osteoarthritis, right knee: Secondary | ICD-10-CM | POA: Diagnosis not present

## 2018-04-13 DIAGNOSIS — Z6832 Body mass index (BMI) 32.0-32.9, adult: Secondary | ICD-10-CM | POA: Diagnosis not present

## 2018-04-13 DIAGNOSIS — R262 Difficulty in walking, not elsewhere classified: Secondary | ICD-10-CM | POA: Diagnosis not present

## 2018-04-13 DIAGNOSIS — M1990 Unspecified osteoarthritis, unspecified site: Secondary | ICD-10-CM | POA: Diagnosis not present

## 2018-04-14 ENCOUNTER — Encounter (INDEPENDENT_AMBULATORY_CARE_PROVIDER_SITE_OTHER): Payer: Self-pay | Admitting: Orthopaedic Surgery

## 2018-04-14 ENCOUNTER — Ambulatory Visit (INDEPENDENT_AMBULATORY_CARE_PROVIDER_SITE_OTHER): Payer: Medicare Other

## 2018-04-14 ENCOUNTER — Ambulatory Visit (INDEPENDENT_AMBULATORY_CARE_PROVIDER_SITE_OTHER): Payer: Medicare Other | Admitting: Orthopaedic Surgery

## 2018-04-14 VITALS — BP 102/51 | HR 69 | Ht 69.0 in | Wt 224.0 lb

## 2018-04-14 DIAGNOSIS — M25562 Pain in left knee: Secondary | ICD-10-CM

## 2018-04-14 DIAGNOSIS — G8929 Other chronic pain: Secondary | ICD-10-CM
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
## 2018-04-16 DIAGNOSIS — R262 Difficulty in walking, not elsewhere classified: Secondary | ICD-10-CM | POA: Diagnosis not present

## 2018-04-16 DIAGNOSIS — I10 Essential (primary) hypertension: Secondary | ICD-10-CM | POA: Diagnosis not present

## 2018-04-16 DIAGNOSIS — R260 Ataxic gait: Secondary | ICD-10-CM | POA: Diagnosis not present

## 2018-04-16 DIAGNOSIS — Z6832 Body mass index (BMI) 32.0-32.9, adult: Secondary | ICD-10-CM | POA: Diagnosis not present

## 2018-04-16 DIAGNOSIS — M1712 Unilateral primary osteoarthritis, left knee: Secondary | ICD-10-CM | POA: Diagnosis not present

## 2018-04-16 DIAGNOSIS — M2560 Stiffness of unspecified joint, not elsewhere classified: Secondary | ICD-10-CM | POA: Diagnosis not present

## 2018-04-16 DIAGNOSIS — Z299 Encounter for prophylactic measures, unspecified: Secondary | ICD-10-CM | POA: Diagnosis not present

## 2018-04-21 DIAGNOSIS — B351 Tinea unguium: Secondary | ICD-10-CM | POA: Diagnosis not present

## 2018-04-21 DIAGNOSIS — E1140 Type 2 diabetes mellitus with diabetic neuropathy, unspecified: Secondary | ICD-10-CM | POA: Diagnosis not present

## 2018-04-21 DIAGNOSIS — L110 Acquired keratosis follicularis: Secondary | ICD-10-CM | POA: Diagnosis not present

## 2018-04-26 DIAGNOSIS — M170 Bilateral primary osteoarthritis of knee: Secondary | ICD-10-CM | POA: Diagnosis not present

## 2018-04-28 ENCOUNTER — Encounter (INDEPENDENT_AMBULATORY_CARE_PROVIDER_SITE_OTHER): Payer: Self-pay | Admitting: Orthopaedic Surgery

## 2018-04-28 ENCOUNTER — Ambulatory Visit (INDEPENDENT_AMBULATORY_CARE_PROVIDER_SITE_OTHER): Payer: Medicare Other | Admitting: Orthopaedic Surgery

## 2018-04-28 VITALS — BP 132/73 | HR 83 | Ht 70.0 in | Wt 208.0 lb

## 2018-04-28 DIAGNOSIS — M1712 Unilateral primary osteoarthritis, left knee: Secondary | ICD-10-CM | POA: Insufficient documentation

## 2018-04-28 DIAGNOSIS — E7800 Pure hypercholesterolemia, unspecified: Secondary | ICD-10-CM | POA: Diagnosis not present

## 2018-04-28 DIAGNOSIS — E119 Type 2 diabetes mellitus without complications: Secondary | ICD-10-CM | POA: Diagnosis not present

## 2018-04-28 DIAGNOSIS — I10 Essential (primary) hypertension: Secondary | ICD-10-CM | POA: Diagnosis not present
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
## 2018-04-30 NOTE — Addendum Note (Signed)
Addended by: Lendon Collar on: 04/30/2018 02:50 PM   Modules accepted: Orders

## 2018-05-04 DIAGNOSIS — E119 Type 2 diabetes mellitus without complications: Secondary | ICD-10-CM | POA: Diagnosis not present

## 2018-05-04 DIAGNOSIS — Z01818 Encounter for other preprocedural examination: Secondary | ICD-10-CM | POA: Diagnosis not present

## 2018-05-04 DIAGNOSIS — E1151 Type 2 diabetes mellitus with diabetic peripheral angiopathy without gangrene: Secondary | ICD-10-CM | POA: Diagnosis not present

## 2018-05-06 DIAGNOSIS — R413 Other amnesia: Secondary | ICD-10-CM | POA: Diagnosis not present

## 2018-05-19 DIAGNOSIS — Z713 Dietary counseling and surveillance: Secondary | ICD-10-CM | POA: Diagnosis not present

## 2018-05-19 DIAGNOSIS — Z6832 Body mass index (BMI) 32.0-32.9, adult: Secondary | ICD-10-CM | POA: Diagnosis not present

## 2018-05-19 DIAGNOSIS — R413 Other amnesia: Secondary | ICD-10-CM | POA: Diagnosis not present

## 2018-05-19 DIAGNOSIS — Z299 Encounter for prophylactic measures, unspecified: Secondary | ICD-10-CM | POA: Diagnosis not present

## 2018-05-19 DIAGNOSIS — I10 Essential (primary) hypertension: Secondary | ICD-10-CM | POA: Diagnosis not present

## 2018-05-26 DIAGNOSIS — E7800 Pure hypercholesterolemia, unspecified: Secondary | ICD-10-CM | POA: Diagnosis not present

## 2018-05-26 DIAGNOSIS — I10 Essential (primary) hypertension: Secondary | ICD-10-CM | POA: Diagnosis not present

## 2018-05-26 DIAGNOSIS — E119 Type 2 diabetes mellitus without complications: Secondary | ICD-10-CM | POA: Diagnosis not present

## 2018-06-18 DIAGNOSIS — R05 Cough: Secondary | ICD-10-CM | POA: Diagnosis not present

## 2018-06-18 DIAGNOSIS — I10 Essential (primary) hypertension: Secondary | ICD-10-CM | POA: Diagnosis not present

## 2018-06-18 DIAGNOSIS — Z85038 Personal history of other malignant neoplasm of large intestine: Secondary | ICD-10-CM | POA: Diagnosis not present

## 2018-06-18 DIAGNOSIS — R110 Nausea: Secondary | ICD-10-CM | POA: Diagnosis not present

## 2018-06-18 DIAGNOSIS — E119 Type 2 diabetes mellitus without complications: Secondary | ICD-10-CM | POA: Diagnosis not present

## 2018-06-18 DIAGNOSIS — J069 Acute upper respiratory infection, unspecified: Secondary | ICD-10-CM | POA: Diagnosis not present

## 2018-06-18 DIAGNOSIS — Z7984 Long term (current) use of oral hypoglycemic drugs: Secondary | ICD-10-CM | POA: Diagnosis not present

## 2018-06-18 DIAGNOSIS — Z79899 Other long term (current) drug therapy: Secondary | ICD-10-CM | POA: Diagnosis not present

## 2018-06-18 DIAGNOSIS — Z8546 Personal history of malignant neoplasm of prostate: Secondary | ICD-10-CM | POA: Diagnosis not present

## 2018-07-07 DIAGNOSIS — B351 Tinea unguium: Secondary | ICD-10-CM | POA: Diagnosis not present

## 2018-07-07 DIAGNOSIS — E1140 Type 2 diabetes mellitus with diabetic neuropathy, unspecified: Secondary | ICD-10-CM | POA: Diagnosis not present

## 2018-07-07 DIAGNOSIS — L110 Acquired keratosis follicularis: Secondary | ICD-10-CM | POA: Diagnosis not present

## 2018-07-15 DIAGNOSIS — E7800 Pure hypercholesterolemia, unspecified: Secondary | ICD-10-CM | POA: Diagnosis not present

## 2018-07-15 DIAGNOSIS — I10 Essential (primary) hypertension: Secondary | ICD-10-CM | POA: Diagnosis not present

## 2018-07-15 DIAGNOSIS — E119 Type 2 diabetes mellitus without complications: Secondary | ICD-10-CM | POA: Diagnosis not present

## 2018-07-19 DIAGNOSIS — Z299 Encounter for prophylactic measures, unspecified: Secondary | ICD-10-CM | POA: Diagnosis not present

## 2018-07-19 DIAGNOSIS — I10 Essential (primary) hypertension: Secondary | ICD-10-CM | POA: Diagnosis not present

## 2018-07-19 DIAGNOSIS — E1165 Type 2 diabetes mellitus with hyperglycemia: Secondary | ICD-10-CM | POA: Diagnosis not present

## 2018-07-19 DIAGNOSIS — E1122 Type 2 diabetes mellitus with diabetic chronic kidney disease: Secondary | ICD-10-CM | POA: Diagnosis not present

## 2018-07-19 DIAGNOSIS — N183 Chronic kidney disease, stage 3 (moderate): Secondary | ICD-10-CM | POA: Diagnosis not present

## 2018-07-19 DIAGNOSIS — I2699 Other pulmonary embolism without acute cor pulmonale: Secondary | ICD-10-CM | POA: Diagnosis not present

## 2018-07-19 DIAGNOSIS — Z6832 Body mass index (BMI) 32.0-32.9, adult: Secondary | ICD-10-CM | POA: Diagnosis not present

## 2018-08-07 DIAGNOSIS — Z7984 Long term (current) use of oral hypoglycemic drugs: Secondary | ICD-10-CM | POA: Diagnosis not present

## 2018-08-07 DIAGNOSIS — M6281 Muscle weakness (generalized): Secondary | ICD-10-CM | POA: Diagnosis not present

## 2018-08-07 DIAGNOSIS — I6611 Occlusion and stenosis of right anterior cerebral artery: Secondary | ICD-10-CM | POA: Diagnosis not present

## 2018-08-07 DIAGNOSIS — Z85038 Personal history of other malignant neoplasm of large intestine: Secondary | ICD-10-CM | POA: Diagnosis not present

## 2018-08-07 DIAGNOSIS — R930 Abnormal findings on diagnostic imaging of skull and head, not elsewhere classified: Secondary | ICD-10-CM | POA: Diagnosis not present

## 2018-08-07 DIAGNOSIS — N390 Urinary tract infection, site not specified: Secondary | ICD-10-CM | POA: Diagnosis not present

## 2018-08-07 DIAGNOSIS — Z79899 Other long term (current) drug therapy: Secondary | ICD-10-CM | POA: Diagnosis not present

## 2018-08-07 DIAGNOSIS — E119 Type 2 diabetes mellitus without complications: Secondary | ICD-10-CM | POA: Diagnosis not present

## 2018-08-07 DIAGNOSIS — R202 Paresthesia of skin: Secondary | ICD-10-CM | POA: Diagnosis not present

## 2018-08-07 DIAGNOSIS — M542 Cervicalgia: Secondary | ICD-10-CM | POA: Diagnosis not present

## 2018-08-07 DIAGNOSIS — R319 Hematuria, unspecified: Secondary | ICD-10-CM | POA: Diagnosis not present

## 2018-08-07 DIAGNOSIS — Z8546 Personal history of malignant neoplasm of prostate: Secondary | ICD-10-CM | POA: Diagnosis not present

## 2018-08-07 DIAGNOSIS — R531 Weakness: Secondary | ICD-10-CM | POA: Diagnosis not present

## 2018-08-07 DIAGNOSIS — R0602 Shortness of breath: Secondary | ICD-10-CM | POA: Diagnosis not present

## 2018-08-07 DIAGNOSIS — R29818 Other symptoms and signs involving the nervous system: Secondary | ICD-10-CM | POA: Diagnosis not present

## 2018-08-07 DIAGNOSIS — R29898 Other symptoms and signs involving the musculoskeletal system: Secondary | ICD-10-CM | POA: Diagnosis not present

## 2018-08-07 DIAGNOSIS — I63512 Cerebral infarction due to unspecified occlusion or stenosis of left middle cerebral artery: Secondary | ICD-10-CM | POA: Diagnosis not present

## 2018-08-07 DIAGNOSIS — Z833 Family history of diabetes mellitus: Secondary | ICD-10-CM | POA: Diagnosis not present

## 2018-08-07 DIAGNOSIS — I10 Essential (primary) hypertension: Secondary | ICD-10-CM | POA: Diagnosis not present

## 2018-08-07 DIAGNOSIS — Z8249 Family history of ischemic heart disease and other diseases of the circulatory system: Secondary | ICD-10-CM | POA: Diagnosis not present

## 2018-08-08 DIAGNOSIS — R29898 Other symptoms and signs involving the musculoskeletal system: Secondary | ICD-10-CM | POA: Diagnosis not present

## 2018-08-08 DIAGNOSIS — R930 Abnormal findings on diagnostic imaging of skull and head, not elsewhere classified: Secondary | ICD-10-CM | POA: Diagnosis not present

## 2018-08-08 DIAGNOSIS — Z79899 Other long term (current) drug therapy: Secondary | ICD-10-CM | POA: Diagnosis not present

## 2018-08-08 DIAGNOSIS — M542 Cervicalgia: Secondary | ICD-10-CM | POA: Diagnosis not present

## 2018-08-08 DIAGNOSIS — M79601 Pain in right arm: Secondary | ICD-10-CM | POA: Diagnosis not present

## 2018-08-08 DIAGNOSIS — R202 Paresthesia of skin: Secondary | ICD-10-CM | POA: Diagnosis not present

## 2018-08-11 DIAGNOSIS — Z6832 Body mass index (BMI) 32.0-32.9, adult: Secondary | ICD-10-CM | POA: Diagnosis not present

## 2018-08-11 DIAGNOSIS — R202 Paresthesia of skin: Secondary | ICD-10-CM | POA: Diagnosis not present

## 2018-08-11 DIAGNOSIS — E1165 Type 2 diabetes mellitus with hyperglycemia: Secondary | ICD-10-CM | POA: Diagnosis not present

## 2018-08-11 DIAGNOSIS — Z299 Encounter for prophylactic measures, unspecified: Secondary | ICD-10-CM | POA: Diagnosis not present

## 2018-08-11 DIAGNOSIS — I10 Essential (primary) hypertension: Secondary | ICD-10-CM | POA: Diagnosis not present

## 2018-08-11 DIAGNOSIS — E1122 Type 2 diabetes mellitus with diabetic chronic kidney disease: Secondary | ICD-10-CM | POA: Diagnosis not present

## 2018-08-11 DIAGNOSIS — I2699 Other pulmonary embolism without acute cor pulmonale: Secondary | ICD-10-CM | POA: Diagnosis not present

## 2018-08-16 DIAGNOSIS — E119 Type 2 diabetes mellitus without complications: Secondary | ICD-10-CM | POA: Diagnosis not present

## 2018-08-16 DIAGNOSIS — I10 Essential (primary) hypertension: Secondary | ICD-10-CM | POA: Diagnosis not present

## 2018-08-16 DIAGNOSIS — E7800 Pure hypercholesterolemia, unspecified: Secondary | ICD-10-CM | POA: Diagnosis not present

## 2018-08-16 DIAGNOSIS — R202 Paresthesia of skin: Secondary | ICD-10-CM | POA: Diagnosis not present

## 2018-08-16 DIAGNOSIS — M25511 Pain in right shoulder: Secondary | ICD-10-CM | POA: Diagnosis not present

## 2018-08-16 DIAGNOSIS — M79601 Pain in right arm: Secondary | ICD-10-CM | POA: Diagnosis not present

## 2018-08-18 DIAGNOSIS — Z79818 Long term (current) use of other agents affecting estrogen receptors and estrogen levels: Secondary | ICD-10-CM | POA: Diagnosis not present

## 2018-08-18 DIAGNOSIS — R350 Frequency of micturition: Secondary | ICD-10-CM | POA: Diagnosis not present

## 2018-08-18 DIAGNOSIS — C61 Malignant neoplasm of prostate: Secondary | ICD-10-CM | POA: Diagnosis not present

## 2018-08-20 DIAGNOSIS — M25511 Pain in right shoulder: Secondary | ICD-10-CM | POA: Diagnosis not present

## 2018-08-20 DIAGNOSIS — M79601 Pain in right arm: Secondary | ICD-10-CM | POA: Diagnosis not present

## 2018-08-20 DIAGNOSIS — R202 Paresthesia of skin: Secondary | ICD-10-CM | POA: Diagnosis not present

## 2018-08-23 DIAGNOSIS — D492 Neoplasm of unspecified behavior of bone, soft tissue, and skin: Secondary | ICD-10-CM | POA: Diagnosis not present

## 2018-08-23 DIAGNOSIS — M50222 Other cervical disc displacement at C5-C6 level: Secondary | ICD-10-CM | POA: Diagnosis not present

## 2018-08-23 DIAGNOSIS — M542 Cervicalgia: Secondary | ICD-10-CM | POA: Diagnosis not present

## 2018-08-24 DIAGNOSIS — E1165 Type 2 diabetes mellitus with hyperglycemia: Secondary | ICD-10-CM | POA: Diagnosis not present

## 2018-08-24 DIAGNOSIS — C801 Malignant (primary) neoplasm, unspecified: Secondary | ICD-10-CM | POA: Diagnosis not present

## 2018-08-24 DIAGNOSIS — Z6832 Body mass index (BMI) 32.0-32.9, adult: Secondary | ICD-10-CM | POA: Diagnosis not present

## 2018-08-24 DIAGNOSIS — Z299 Encounter for prophylactic measures, unspecified: Secondary | ICD-10-CM | POA: Diagnosis not present

## 2018-08-24 DIAGNOSIS — N179 Acute kidney failure, unspecified: Secondary | ICD-10-CM | POA: Diagnosis not present

## 2018-08-24 DIAGNOSIS — C7951 Secondary malignant neoplasm of bone: Secondary | ICD-10-CM | POA: Diagnosis not present

## 2018-08-25 DIAGNOSIS — C7951 Secondary malignant neoplasm of bone: Secondary | ICD-10-CM | POA: Diagnosis not present

## 2018-08-25 DIAGNOSIS — C61 Malignant neoplasm of prostate: Secondary | ICD-10-CM | POA: Diagnosis not present

## 2018-08-26 DIAGNOSIS — C61 Malignant neoplasm of prostate: Secondary | ICD-10-CM | POA: Diagnosis not present

## 2018-08-26 DIAGNOSIS — R161 Splenomegaly, not elsewhere classified: Secondary | ICD-10-CM | POA: Diagnosis not present

## 2018-08-26 DIAGNOSIS — I700 Atherosclerosis of aorta: Secondary | ICD-10-CM | POA: Diagnosis not present

## 2018-08-26 DIAGNOSIS — N400 Enlarged prostate without lower urinary tract symptoms: Secondary | ICD-10-CM | POA: Diagnosis not present

## 2018-08-26 DIAGNOSIS — C7951 Secondary malignant neoplasm of bone: Secondary | ICD-10-CM | POA: Diagnosis not present

## 2018-08-26 DIAGNOSIS — R911 Solitary pulmonary nodule: Secondary | ICD-10-CM | POA: Diagnosis not present

## 2018-08-26 DIAGNOSIS — N200 Calculus of kidney: Secondary | ICD-10-CM | POA: Diagnosis not present

## 2018-08-27 DIAGNOSIS — C7951 Secondary malignant neoplasm of bone: Secondary | ICD-10-CM | POA: Diagnosis not present

## 2018-08-27 DIAGNOSIS — C61 Malignant neoplasm of prostate: Secondary | ICD-10-CM | POA: Diagnosis not present

## 2018-08-30 DIAGNOSIS — C61 Malignant neoplasm of prostate: Secondary | ICD-10-CM | POA: Diagnosis not present

## 2018-08-30 DIAGNOSIS — C7951 Secondary malignant neoplasm of bone: Secondary | ICD-10-CM | POA: Diagnosis not present

## 2018-08-31 DIAGNOSIS — C7951 Secondary malignant neoplasm of bone: Secondary | ICD-10-CM | POA: Diagnosis not present

## 2018-08-31 DIAGNOSIS — C61 Malignant neoplasm of prostate: Secondary | ICD-10-CM | POA: Diagnosis not present

## 2018-09-01 DIAGNOSIS — C61 Malignant neoplasm of prostate: Secondary | ICD-10-CM | POA: Diagnosis not present

## 2018-09-01 DIAGNOSIS — C7951 Secondary malignant neoplasm of bone: Secondary | ICD-10-CM | POA: Diagnosis not present

## 2018-09-02 DIAGNOSIS — C61 Malignant neoplasm of prostate: Secondary | ICD-10-CM | POA: Diagnosis not present

## 2018-09-02 DIAGNOSIS — C7951 Secondary malignant neoplasm of bone: Secondary | ICD-10-CM | POA: Diagnosis not present

## 2018-09-06 DIAGNOSIS — C7951 Secondary malignant neoplasm of bone: Secondary | ICD-10-CM | POA: Diagnosis not present

## 2018-09-06 DIAGNOSIS — C61 Malignant neoplasm of prostate: Secondary | ICD-10-CM | POA: Diagnosis not present

## 2018-09-07 DIAGNOSIS — C61 Malignant neoplasm of prostate: Secondary | ICD-10-CM | POA: Diagnosis not present

## 2018-09-07 DIAGNOSIS — C7951 Secondary malignant neoplasm of bone: Secondary | ICD-10-CM | POA: Diagnosis not present

## 2018-09-08 DIAGNOSIS — Z6831 Body mass index (BMI) 31.0-31.9, adult: Secondary | ICD-10-CM | POA: Diagnosis not present

## 2018-09-08 DIAGNOSIS — C801 Malignant (primary) neoplasm, unspecified: Secondary | ICD-10-CM | POA: Diagnosis not present

## 2018-09-08 DIAGNOSIS — Z299 Encounter for prophylactic measures, unspecified: Secondary | ICD-10-CM | POA: Diagnosis not present

## 2018-09-08 DIAGNOSIS — E1122 Type 2 diabetes mellitus with diabetic chronic kidney disease: Secondary | ICD-10-CM | POA: Diagnosis not present

## 2018-09-08 DIAGNOSIS — C61 Malignant neoplasm of prostate: Secondary | ICD-10-CM | POA: Diagnosis not present

## 2018-09-08 DIAGNOSIS — I10 Essential (primary) hypertension: Secondary | ICD-10-CM | POA: Diagnosis not present

## 2018-09-08 DIAGNOSIS — C7951 Secondary malignant neoplasm of bone: Secondary | ICD-10-CM | POA: Diagnosis not present

## 2018-09-08 DIAGNOSIS — L89309 Pressure ulcer of unspecified buttock, unspecified stage: Secondary | ICD-10-CM | POA: Diagnosis not present

## 2018-09-09 DIAGNOSIS — C7951 Secondary malignant neoplasm of bone: Secondary | ICD-10-CM | POA: Diagnosis not present

## 2018-09-09 DIAGNOSIS — C61 Malignant neoplasm of prostate: Secondary | ICD-10-CM | POA: Diagnosis not present

## 2018-09-10 DIAGNOSIS — C7951 Secondary malignant neoplasm of bone: Secondary | ICD-10-CM | POA: Diagnosis not present

## 2018-09-10 DIAGNOSIS — C61 Malignant neoplasm of prostate: Secondary | ICD-10-CM | POA: Diagnosis not present

## 2018-09-13 DIAGNOSIS — C7951 Secondary malignant neoplasm of bone: Secondary | ICD-10-CM | POA: Diagnosis not present

## 2018-09-13 DIAGNOSIS — C61 Malignant neoplasm of prostate: Secondary | ICD-10-CM | POA: Diagnosis not present

## 2018-09-15 DIAGNOSIS — C7951 Secondary malignant neoplasm of bone: Secondary | ICD-10-CM | POA: Diagnosis not present

## 2018-09-15 DIAGNOSIS — Z923 Personal history of irradiation: Secondary | ICD-10-CM | POA: Diagnosis not present

## 2018-09-15 DIAGNOSIS — R350 Frequency of micturition: Secondary | ICD-10-CM | POA: Diagnosis not present

## 2018-09-15 DIAGNOSIS — C61 Malignant neoplasm of prostate: Secondary | ICD-10-CM | POA: Diagnosis not present

## 2018-09-15 DIAGNOSIS — Z9079 Acquired absence of other genital organ(s): Secondary | ICD-10-CM | POA: Diagnosis not present

## 2018-09-15 DIAGNOSIS — Z79818 Long term (current) use of other agents affecting estrogen receptors and estrogen levels: Secondary | ICD-10-CM | POA: Diagnosis not present

## 2018-09-22 DIAGNOSIS — L110 Acquired keratosis follicularis: Secondary | ICD-10-CM | POA: Diagnosis not present

## 2018-09-22 DIAGNOSIS — B351 Tinea unguium: Secondary | ICD-10-CM | POA: Diagnosis not present

## 2018-09-22 DIAGNOSIS — E1140 Type 2 diabetes mellitus with diabetic neuropathy, unspecified: Secondary | ICD-10-CM | POA: Diagnosis not present

## 2018-09-30 ENCOUNTER — Other Ambulatory Visit (HOSPITAL_COMMUNITY): Payer: Self-pay | Admitting: Radiation Oncology

## 2018-09-30 DIAGNOSIS — C7952 Secondary malignant neoplasm of bone marrow: Secondary | ICD-10-CM

## 2018-09-30 DIAGNOSIS — C7951 Secondary malignant neoplasm of bone: Secondary | ICD-10-CM

## 2018-09-30 DIAGNOSIS — C61 Malignant neoplasm of prostate: Secondary | ICD-10-CM

## 2018-10-06 ENCOUNTER — Encounter (HOSPITAL_COMMUNITY)
Admission: RE | Admit: 2018-10-06 | Discharge: 2018-10-06 | Disposition: A | Discharge status: Home / Self Care | Payer: Medicare Other | Source: Ambulatory Visit | Attending: Radiation Oncology | Admitting: Radiation Oncology

## 2018-10-06 ENCOUNTER — Encounter (HOSPITAL_COMMUNITY): Payer: Self-pay

## 2018-10-06 ENCOUNTER — Other Ambulatory Visit: Payer: Self-pay

## 2018-10-06 DIAGNOSIS — C7951 Secondary malignant neoplasm of bone: Secondary | ICD-10-CM | POA: Insufficient documentation

## 2018-10-06 DIAGNOSIS — C61 Malignant neoplasm of prostate: Secondary | ICD-10-CM | POA: Diagnosis not present

## 2018-10-06 DIAGNOSIS — Z8546 Personal history of malignant neoplasm of prostate: Secondary | ICD-10-CM | POA: Diagnosis not present

## 2018-10-06 DIAGNOSIS — C7952 Secondary malignant neoplasm of bone marrow: Secondary | ICD-10-CM | POA: Diagnosis not present

## 2018-10-06 MED ORDER — TECHNETIUM TC 99M MEDRONATE IV KIT
20.0000 | PACK | Freq: Once | INTRAVENOUS | Status: AC | PRN
  Administered 2018-10-06: 09:00:00 20.7 via INTRAVENOUS

## 2018-10-08 DIAGNOSIS — E7800 Pure hypercholesterolemia, unspecified: Secondary | ICD-10-CM | POA: Diagnosis not present

## 2018-10-08 DIAGNOSIS — E119 Type 2 diabetes mellitus without complications: Secondary | ICD-10-CM | POA: Diagnosis not present

## 2018-10-08 DIAGNOSIS — I10 Essential (primary) hypertension: Secondary | ICD-10-CM | POA: Diagnosis not present

## 2018-10-13 DIAGNOSIS — C61 Malignant neoplasm of prostate: Secondary | ICD-10-CM | POA: Diagnosis not present

## 2018-10-13 DIAGNOSIS — Z87442 Personal history of urinary calculi: Secondary | ICD-10-CM | POA: Diagnosis not present

## 2018-10-13 DIAGNOSIS — Z85038 Personal history of other malignant neoplasm of large intestine: Secondary | ICD-10-CM | POA: Diagnosis not present

## 2018-10-13 DIAGNOSIS — E119 Type 2 diabetes mellitus without complications: Secondary | ICD-10-CM | POA: Diagnosis not present

## 2018-10-13 DIAGNOSIS — Z87891 Personal history of nicotine dependence: Secondary | ICD-10-CM | POA: Diagnosis not present

## 2018-10-13 DIAGNOSIS — Z86711 Personal history of pulmonary embolism: Secondary | ICD-10-CM | POA: Diagnosis not present

## 2018-10-13 DIAGNOSIS — Z923 Personal history of irradiation: Secondary | ICD-10-CM | POA: Diagnosis not present

## 2018-10-13 DIAGNOSIS — R200 Anesthesia of skin: Secondary | ICD-10-CM | POA: Diagnosis not present

## 2018-10-13 DIAGNOSIS — C7951 Secondary malignant neoplasm of bone: Secondary | ICD-10-CM | POA: Diagnosis not present

## 2018-10-27 DIAGNOSIS — E1122 Type 2 diabetes mellitus with diabetic chronic kidney disease: Secondary | ICD-10-CM | POA: Diagnosis not present

## 2018-10-27 DIAGNOSIS — Z299 Encounter for prophylactic measures, unspecified: Secondary | ICD-10-CM | POA: Diagnosis not present

## 2018-10-27 DIAGNOSIS — N183 Chronic kidney disease, stage 3 (moderate): Secondary | ICD-10-CM | POA: Diagnosis not present

## 2018-10-27 DIAGNOSIS — Z6831 Body mass index (BMI) 31.0-31.9, adult: Secondary | ICD-10-CM | POA: Diagnosis not present

## 2018-10-27 DIAGNOSIS — I2699 Other pulmonary embolism without acute cor pulmonale: Secondary | ICD-10-CM | POA: Diagnosis not present

## 2018-10-27 DIAGNOSIS — I10 Essential (primary) hypertension: Secondary | ICD-10-CM | POA: Diagnosis not present

## 2018-10-27 DIAGNOSIS — E1165 Type 2 diabetes mellitus with hyperglycemia: Secondary | ICD-10-CM | POA: Diagnosis not present

## 2018-10-27 DIAGNOSIS — E119 Type 2 diabetes mellitus without complications: Secondary | ICD-10-CM | POA: Diagnosis not present

## 2018-11-04 DIAGNOSIS — I10 Essential (primary) hypertension: Secondary | ICD-10-CM | POA: Diagnosis not present

## 2018-11-04 DIAGNOSIS — E7800 Pure hypercholesterolemia, unspecified: Secondary | ICD-10-CM | POA: Diagnosis not present

## 2018-11-04 DIAGNOSIS — E119 Type 2 diabetes mellitus without complications: Secondary | ICD-10-CM | POA: Diagnosis not present

## 2018-11-19 DIAGNOSIS — C61 Malignant neoplasm of prostate: Secondary | ICD-10-CM | POA: Diagnosis not present

## 2018-11-19 DIAGNOSIS — C7951 Secondary malignant neoplasm of bone: Secondary | ICD-10-CM | POA: Diagnosis not present

## 2018-11-23 DIAGNOSIS — Z6832 Body mass index (BMI) 32.0-32.9, adult: Secondary | ICD-10-CM | POA: Diagnosis not present

## 2018-11-23 DIAGNOSIS — I10 Essential (primary) hypertension: Secondary | ICD-10-CM | POA: Diagnosis not present

## 2018-11-23 DIAGNOSIS — N183 Chronic kidney disease, stage 3 (moderate): Secondary | ICD-10-CM | POA: Diagnosis not present

## 2018-11-23 DIAGNOSIS — Z299 Encounter for prophylactic measures, unspecified: Secondary | ICD-10-CM | POA: Diagnosis not present

## 2018-11-23 DIAGNOSIS — E1165 Type 2 diabetes mellitus with hyperglycemia: Secondary | ICD-10-CM | POA: Diagnosis not present

## 2018-11-23 DIAGNOSIS — D492 Neoplasm of unspecified behavior of bone, soft tissue, and skin: Secondary | ICD-10-CM | POA: Diagnosis not present

## 2018-12-01 DIAGNOSIS — L110 Acquired keratosis follicularis: Secondary | ICD-10-CM | POA: Diagnosis not present

## 2018-12-01 DIAGNOSIS — E1140 Type 2 diabetes mellitus with diabetic neuropathy, unspecified: Secondary | ICD-10-CM | POA: Diagnosis not present

## 2018-12-01 DIAGNOSIS — B351 Tinea unguium: Secondary | ICD-10-CM | POA: Diagnosis not present

## 2018-12-06 DIAGNOSIS — D0462 Carcinoma in situ of skin of left upper limb, including shoulder: Secondary | ICD-10-CM | POA: Diagnosis not present

## 2018-12-06 DIAGNOSIS — C44622 Squamous cell carcinoma of skin of right upper limb, including shoulder: Secondary | ICD-10-CM | POA: Diagnosis not present

## 2018-12-16 DIAGNOSIS — I10 Essential (primary) hypertension: Secondary | ICD-10-CM | POA: Diagnosis not present

## 2018-12-16 DIAGNOSIS — E7800 Pure hypercholesterolemia, unspecified: Secondary | ICD-10-CM | POA: Diagnosis not present

## 2018-12-16 DIAGNOSIS — E119 Type 2 diabetes mellitus without complications: Secondary | ICD-10-CM | POA: Diagnosis not present

## 2018-12-27 DIAGNOSIS — Z23 Encounter for immunization: Secondary | ICD-10-CM | POA: Diagnosis not present

## 2019-01-18 DIAGNOSIS — Z0000 Encounter for general adult medical examination without abnormal findings: Secondary | ICD-10-CM | POA: Diagnosis not present

## 2019-01-18 DIAGNOSIS — Z6832 Body mass index (BMI) 32.0-32.9, adult: Secondary | ICD-10-CM | POA: Diagnosis not present

## 2019-01-18 DIAGNOSIS — E7800 Pure hypercholesterolemia, unspecified: Secondary | ICD-10-CM | POA: Diagnosis not present

## 2019-01-18 DIAGNOSIS — Z1339 Encounter for screening examination for other mental health and behavioral disorders: Secondary | ICD-10-CM | POA: Diagnosis not present

## 2019-01-18 DIAGNOSIS — Z299 Encounter for prophylactic measures, unspecified: Secondary | ICD-10-CM | POA: Diagnosis not present

## 2019-01-18 DIAGNOSIS — R5383 Other fatigue: Secondary | ICD-10-CM | POA: Diagnosis not present

## 2019-01-18 DIAGNOSIS — Z1211 Encounter for screening for malignant neoplasm of colon: Secondary | ICD-10-CM | POA: Diagnosis not present

## 2019-01-18 DIAGNOSIS — I10 Essential (primary) hypertension: Secondary | ICD-10-CM | POA: Diagnosis not present

## 2019-01-18 DIAGNOSIS — Z79899 Other long term (current) drug therapy: Secondary | ICD-10-CM | POA: Diagnosis not present

## 2019-01-18 DIAGNOSIS — Z1331 Encounter for screening for depression: Secondary | ICD-10-CM | POA: Diagnosis not present

## 2019-01-18 DIAGNOSIS — Z7189 Other specified counseling: Secondary | ICD-10-CM | POA: Diagnosis not present

## 2019-01-18 DIAGNOSIS — Z125 Encounter for screening for malignant neoplasm of prostate: Secondary | ICD-10-CM | POA: Diagnosis not present

## 2019-01-20 DIAGNOSIS — X32XXXD Exposure to sunlight, subsequent encounter: Secondary | ICD-10-CM | POA: Diagnosis not present

## 2019-01-20 DIAGNOSIS — Z85828 Personal history of other malignant neoplasm of skin: Secondary | ICD-10-CM | POA: Diagnosis not present

## 2019-01-20 DIAGNOSIS — L570 Actinic keratosis: Secondary | ICD-10-CM | POA: Diagnosis not present

## 2019-01-20 DIAGNOSIS — Z08 Encounter for follow-up examination after completed treatment for malignant neoplasm: Secondary | ICD-10-CM | POA: Diagnosis not present

## 2019-02-03 DIAGNOSIS — N183 Chronic kidney disease, stage 3 unspecified: Secondary | ICD-10-CM | POA: Diagnosis not present

## 2019-02-03 DIAGNOSIS — E1165 Type 2 diabetes mellitus with hyperglycemia: Secondary | ICD-10-CM | POA: Diagnosis not present

## 2019-02-03 DIAGNOSIS — I2699 Other pulmonary embolism without acute cor pulmonale: Secondary | ICD-10-CM | POA: Diagnosis not present

## 2019-02-03 DIAGNOSIS — E1122 Type 2 diabetes mellitus with diabetic chronic kidney disease: Secondary | ICD-10-CM | POA: Diagnosis not present

## 2019-02-03 DIAGNOSIS — Z6831 Body mass index (BMI) 31.0-31.9, adult: Secondary | ICD-10-CM | POA: Diagnosis not present

## 2019-02-03 DIAGNOSIS — Z299 Encounter for prophylactic measures, unspecified: Secondary | ICD-10-CM | POA: Diagnosis not present

## 2019-02-03 DIAGNOSIS — I10 Essential (primary) hypertension: Secondary | ICD-10-CM | POA: Diagnosis not present

## 2019-02-07 DIAGNOSIS — C7951 Secondary malignant neoplasm of bone: Secondary | ICD-10-CM | POA: Diagnosis not present

## 2019-02-07 DIAGNOSIS — R918 Other nonspecific abnormal finding of lung field: Secondary | ICD-10-CM | POA: Diagnosis not present

## 2019-02-07 DIAGNOSIS — C61 Malignant neoplasm of prostate: Secondary | ICD-10-CM | POA: Diagnosis not present

## 2019-02-07 DIAGNOSIS — J929 Pleural plaque without asbestos: Secondary | ICD-10-CM | POA: Diagnosis not present

## 2019-02-07 DIAGNOSIS — I700 Atherosclerosis of aorta: Secondary | ICD-10-CM | POA: Diagnosis not present

## 2019-02-07 DIAGNOSIS — N200 Calculus of kidney: Secondary | ICD-10-CM | POA: Diagnosis not present

## 2019-02-09 DIAGNOSIS — C7951 Secondary malignant neoplasm of bone: Secondary | ICD-10-CM | POA: Diagnosis not present

## 2019-02-10 DIAGNOSIS — L110 Acquired keratosis follicularis: Secondary | ICD-10-CM | POA: Diagnosis not present

## 2019-02-10 DIAGNOSIS — E1140 Type 2 diabetes mellitus with diabetic neuropathy, unspecified: Secondary | ICD-10-CM | POA: Diagnosis not present

## 2019-02-10 DIAGNOSIS — B351 Tinea unguium: Secondary | ICD-10-CM | POA: Diagnosis not present

## 2019-03-08 DIAGNOSIS — E279 Disorder of adrenal gland, unspecified: Secondary | ICD-10-CM | POA: Diagnosis not present

## 2019-03-08 DIAGNOSIS — R911 Solitary pulmonary nodule: Secondary | ICD-10-CM | POA: Diagnosis not present

## 2019-03-10 DIAGNOSIS — E278 Other specified disorders of adrenal gland: Secondary | ICD-10-CM | POA: Diagnosis not present

## 2019-03-10 DIAGNOSIS — C7492 Malignant neoplasm of unspecified part of left adrenal gland: Secondary | ICD-10-CM | POA: Diagnosis not present

## 2019-03-23 DIAGNOSIS — R350 Frequency of micturition: Secondary | ICD-10-CM | POA: Diagnosis not present

## 2019-03-23 DIAGNOSIS — C7951 Secondary malignant neoplasm of bone: Secondary | ICD-10-CM | POA: Diagnosis not present

## 2019-03-23 DIAGNOSIS — Z192 Hormone resistant malignancy status: Secondary | ICD-10-CM | POA: Diagnosis not present

## 2019-03-23 DIAGNOSIS — E279 Disorder of adrenal gland, unspecified: Secondary | ICD-10-CM | POA: Diagnosis not present

## 2019-04-28 DIAGNOSIS — L110 Acquired keratosis follicularis: Secondary | ICD-10-CM | POA: Diagnosis not present

## 2019-04-28 DIAGNOSIS — E1140 Type 2 diabetes mellitus with diabetic neuropathy, unspecified: Secondary | ICD-10-CM | POA: Diagnosis not present

## 2019-04-28 DIAGNOSIS — B351 Tinea unguium: Secondary | ICD-10-CM | POA: Diagnosis not present

## 2019-05-10 ENCOUNTER — Other Ambulatory Visit (HOSPITAL_COMMUNITY)
Admission: RE | Admit: 2019-05-10 | Discharge: 2019-05-10 | Disposition: A | Discharge status: Home / Self Care | Source: Hospice | Attending: Adult Health Nurse Practitioner | Admitting: Adult Health Nurse Practitioner

## 2019-05-10 DIAGNOSIS — N390 Urinary tract infection, site not specified: Secondary | ICD-10-CM | POA: Insufficient documentation

## 2019-05-10 LAB — URINALYSIS, COMPLETE (UACMP) WITH MICROSCOPIC
Bilirubin Urine: NEGATIVE
Glucose, UA: NEGATIVE mg/dL
Ketones, ur: NEGATIVE mg/dL
Nitrite: NEGATIVE
Protein, ur: 30 mg/dL — AB
RBC / HPF: >50 RBC/hpf — ABNORMAL HIGH (ref 0–5)
Specific Gravity, Urine: 1.014 (ref 1.005–1.030)
WBC, UA: >50 WBC/hpf — ABNORMAL HIGH (ref 0–5)
pH: 5 (ref 5.0–8.0)

## 2019-05-12 LAB — URINE CULTURE: Culture: >=100000 — AB

## 2019-06-02 DEATH — deceased

## 2022-07-14 ENCOUNTER — Ambulatory Visit: Payer: Self-pay | Admitting: Internal Medicine
